# Patient Record
Sex: Female | Born: 1975 | Race: White | Hispanic: No | Smoking: Former smoker
Health system: Southern US, Community
[De-identification: ages and names within clinical notes are randomized; demographics above are authoritative.]

## PROBLEM LIST (undated history)

## (undated) DIAGNOSIS — R0789 Other chest pain: Secondary | ICD-10-CM

## (undated) DIAGNOSIS — F419 Anxiety disorder, unspecified: Secondary | ICD-10-CM

## (undated) DIAGNOSIS — F99 Mental disorder, not otherwise specified: Secondary | ICD-10-CM

## (undated) HISTORY — PX: NO PAST SURGERIES: SHX2092

## (undated) HISTORY — DX: Anxiety disorder, unspecified: F41.9

## (undated) HISTORY — DX: Mental disorder, not otherwise specified: F99

---

## 2016-01-04 ENCOUNTER — Inpatient Hospital Stay
Admit: 2016-01-04 | Discharge: 2016-01-04 | Disposition: A | Discharge status: Home / Self Care | Payer: Self-pay | Attending: Emergency Medicine

## 2016-01-04 DIAGNOSIS — R6884 Jaw pain: Secondary | ICD-10-CM

## 2016-01-04 MED ORDER — METHOCARBAMOL 750 MG TAB
750 mg | ORAL_TABLET | Freq: Three times a day (TID) | ORAL | 0 refills | Status: AC
Start: 2016-01-04 — End: ?

## 2016-01-04 MED ORDER — PREDNISONE 20 MG TAB
20 mg | ORAL | Status: AC
Start: 2016-01-04 — End: 2016-01-04
  Administered 2016-01-04: 15:00:00 via ORAL

## 2016-01-04 MED ORDER — PREDNISONE 20 MG TAB
20 mg | ORAL_TABLET | Freq: Every day | ORAL | 0 refills | Status: AC
Start: 2016-01-04 — End: 2016-01-09

## 2016-01-04 MED ORDER — TRAMADOL 50 MG TAB
50 mg | ORAL_TABLET | Freq: Three times a day (TID) | ORAL | 0 refills | Status: AC | PRN
Start: 2016-01-04 — End: ?

## 2016-01-04 MED FILL — PREDNISONE 20 MG TAB: 20 mg | ORAL | Qty: 3

## 2016-01-04 NOTE — ED Notes (Signed)
Provider at bedside.

## 2016-01-04 NOTE — ED Provider Notes (Signed)
Patient is a 40 y.o. female presenting with jaw pain.   Jaw Pain    Pertinent negatives include no vomiting.   Pt states that she has had left sided jaw pain for 3-4 days. The pain increases with chewing and yawning. She denies any recent dental trauma or injury. Denies any fever, facial swelling or facial erythema. Denies any difficulty breathing, difficulty swallowing, SOB or chest pain.Denies cold symptoms,headache, neck pain, visual changes, focal weakness or rash. Pt states that seh went to [facility] yesterday and was referred to the ED. She states that the pain has kept her awake for a "good part of the night". She states that she took one of her mother's Tylenol #3 without relief.       History reviewed. No pertinent past medical history.    History reviewed. No pertinent surgical history.      History reviewed. No pertinent family history.    Social History     Social History   ??? Marital status: N/A     Spouse name: N/A   ??? Number of children: N/A   ??? Years of education: N/A     Occupational History   ??? Not on file.     Social History Main Topics   ??? Smoking status: Current Some Day Smoker   ??? Smokeless tobacco: Never Used      Comment: 3 cigarrettes a week   ??? Alcohol use No   ??? Drug use: Not on file   ??? Sexual activity: Not on file     Other Topics Concern   ??? Not on file     Social History Narrative   ??? No narrative on file         ALLERGIES: Review of patient's allergies indicates no known allergies.    Review of Systems   Constitutional: Negative for activity change and appetite change.   HENT: Positive for dental problem and ear pain. Negative for facial swelling, sore throat and trouble swallowing.    Eyes: Negative.    Respiratory: Negative for shortness of breath.    Cardiovascular: Negative.    Gastrointestinal: Negative for abdominal pain, diarrhea and vomiting.   Genitourinary: Negative for dysuria.   Musculoskeletal: Negative for back pain and neck pain.   Skin: Negative for color change.    Neurological: Negative for headaches.   Psychiatric/Behavioral: Negative.        Vitals:    01/04/16 0944   BP: 119/76   Pulse: 89   Resp: 18   Temp: 98.1 ??F (36.7 ??C)   SpO2: 100%   Weight: 59.6 kg (131 lb 6 oz)   Height: 5\' 6"  (1.676 m)            Physical Exam   Constitutional: She is oriented to person, place, and time. She appears well-nourished.   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   Right Ear: External ear normal.   Left Ear: External ear normal.   Mouth/Throat: Oropharynx is clear and moist.   No obvious dental caries, gum swelling, or obvious dental abscess   Eyes: Pupils are equal, round, and reactive to light.   Neck: Normal range of motion. Neck supple.   Cardiovascular: Normal rate and regular rhythm.    Pulmonary/Chest: Effort normal and breath sounds normal.   Abdominal: Bowel sounds are normal.   Musculoskeletal: Normal range of motion.   Lymphadenopathy:     She has no cervical adenopathy.   Neurological:  She is alert and oriented to person, place, and time.   Skin: Skin is warm and dry. No rash noted.   Nursing note and vitals reviewed.       MDM  ED Course       Procedures      Mouth care and emergency dental care instructions were reviewed with the pt.  Suspect TMJ pain. Plan to treat conservatively with short course of prednisone and few pain pills; close dental follow up.  10:08 AM  Patient's results and plan of care have been reviewed with her.  Patient has verbally conveyed her understanding and agreement of her signs, symptoms, diagnosis, treatment and prognosis and additionally agrees to follow up as recommended or return to the Emergency Room should her condition change prior to follow-up.  Discharge instructions have also been provided to the patient with some educational information regarding her diagnosis as well a list of reasons why she would want to return to the ER prior to her follow-up appointment should her condition change.   Discussed plan of care with Dr. Marlene BastMason. Yetta NumbersLori Kaneesha Constantino, NP

## 2016-01-04 NOTE — ED Triage Notes (Signed)
Pt c/o Left sided posterior jaw pain for 4-5 days, radiating into L ear, does not radiate into arm or chest. Difficulty eating, bite feels "off," denies trauma. Seen at [facility] last night and told to come to ED. Reports recent stress r/t family issues.

## 2016-02-17 ENCOUNTER — Emergency Department

## 2016-02-17 ENCOUNTER — Emergency Department: Admit: 2016-02-18 | Payer: PRIVATE HEALTH INSURANCE

## 2016-02-17 DIAGNOSIS — R0789 Other chest pain: Secondary | ICD-10-CM

## 2016-02-17 NOTE — ED Triage Notes (Signed)
Patient arrives c/o chest pain that started last night that has gotten progressively worse. +SOB. Patient took a muscle relaxer and charcoal pills with no relief.

## 2016-02-17 NOTE — ED Provider Notes (Signed)
HPI Comments: This is a 41 yo Caucasian female with complaint of mid upper chest pain, constant, 8/10, pleuritic for past 24 hrs.  Feels like "need to belch" but does not improve pain. Pain does not radiate. Tried taking Xanax with minimal improvement.  No cardiac hx.  [facility].  No associated dizziness, extremity numbness, extremity weakness, nausea, vomiting, abdominal pain, nausea, vomiting, LE edema, calf pain, diarrhea, constipation or urinary complaint. No hx of similar.     Patient is a 41 y.o. female presenting with chest pain. The history is provided by the patient.   Chest Pain (Angina)    Associated symptoms include shortness of breath. Pertinent negatives include no abdominal pain, no back pain, no cough, no dizziness, no fever, no headaches, no nausea, no numbness and no vomiting.        History reviewed. No pertinent past medical history.    History reviewed. No pertinent surgical history.      History reviewed. No pertinent family history.    Social History     Social History   ??? Marital status: SINGLE     Spouse name: N/A   ??? Number of children: N/A   ??? Years of education: N/A     Occupational History   ??? Not on file.     Social History Main Topics   ??? Smoking status: Current Some Day Smoker   ??? Smokeless tobacco: Never Used      Comment: 3 cigarrettes a week   ??? Alcohol use No   ??? Drug use: Not on file   ??? Sexual activity: Not on file     Other Topics Concern   ??? Not on file     Social History Narrative         ALLERGIES: Review of patient's allergies indicates no known allergies.    Review of Systems   Constitutional: Negative.  Negative for chills, fatigue and fever.   HENT: Negative.  Negative for congestion, ear pain, facial swelling, rhinorrhea, sneezing and sore throat.    Eyes: Negative for pain, discharge and itching.   Respiratory: Positive for shortness of breath. Negative for cough and chest tightness.     Cardiovascular: Positive for chest pain. Negative for leg swelling.   Gastrointestinal: Negative.  Negative for abdominal distention, abdominal pain, constipation, diarrhea, nausea and vomiting.   Genitourinary: Negative for difficulty urinating, frequency and urgency.   Musculoskeletal: Positive for arthralgias (left elbow). Negative for back pain.   Skin: Negative for color change and rash.   Neurological: Negative for dizziness, numbness and headaches.   All other systems reviewed and are negative.      Vitals:    02/17/16 2134   BP: 101/68   Pulse: (!) 122   Resp: 18   Temp: 98.7 ??F (37.1 ??C)   SpO2: 100%   Weight: 62.2 kg (137 lb 3.2 oz)   Height: 5\' 6"  (1.676 m)            Physical Exam   Constitutional: She is oriented to person, place, and time. She appears well-developed and well-nourished. No distress.   Caucasian female sitting upright appears uncomfortable but in NAD   HENT:   Head: Normocephalic and atraumatic.   Right Ear: External ear normal.   Left Ear: External ear normal.   Nose: Nose normal.   Mouth/Throat: Oropharynx is clear and moist. No oropharyngeal exudate.   Eyes: Conjunctivae and EOM are normal. Pupils are equal, round, and reactive  to light. Right eye exhibits no discharge. Left eye exhibits no discharge. No scleral icterus.   Neck: Normal range of motion.   Cardiovascular: Regular rhythm.  Exam reveals no gallop and no friction rub.    No murmur heard.  Tachy 122     Pulmonary/Chest: Effort normal and breath sounds normal. She has no wheezes. She has no rales. She exhibits no tenderness.   Abdominal: Soft. Bowel sounds are normal. She exhibits no distension. There is no tenderness. There is no rebound and no guarding.   Neurological: She is alert and oriented to person, place, and time.   Skin: Skin is warm and dry. She is not diaphoretic.   Psychiatric: She has a normal mood and affect. Her behavior is normal.   Nursing note and vitals reviewed.       MDM   Number of Diagnoses or Management Options  Diagnosis management comments: 41 yo Caucasian female with 24 hrs of chest pain with reported pleuritic quality. ? ACS vs PE vs PNA amongst others  Plan  EKG  Trop  CBC  CMP  Xray chest  CTA  Analgesia  reassess       Amount and/or Complexity of Data Reviewed  Clinical lab tests: ordered and reviewed  Tests in the radiology section of CPT??: ordered and reviewed          ED Course       Procedures  Progress note    EKG interpretation: (Preliminary)  Rhythm: sinus tachycardia; and regular . Rate (approx.): 110; Axis: normal; P wave: normal; QRS interval: normal ; ST/T wave: normal; in  Leads. Amarrah Meinhart C [place], [place]           Trop - with >24 hrs constant pain. EKG non-ischemic. Electrolytes and CBC unremarkable. ? Anxiety vs GERD. Trek Kimball C [place], [place]    Patient's results have been reviewed with them.  Patient and/or family have verbally conveyed their understanding and agreement of the patient's signs, symptoms, diagnosis, treatment and prognosis and additionally agree to follow up as recommended or return to the Emergency Room should their condition change prior to follow-up.  Discharge instructions have also been provided to the patient with some educational information regarding their diagnosis as well a list of reasons why they would want to return to the ER prior to their follow-up appointment should their condition change. Halynn Reitano C [place], [place]    A/P  Chest pain atypical: Pepcid twice daily. Follow-up with regular doctor. Return for any new or worsening. Leanora Murin C [place], [place]

## 2016-02-18 ENCOUNTER — Inpatient Hospital Stay
Admit: 2016-02-18 | Discharge: 2016-02-18 | Disposition: A | Discharge status: Home / Self Care | Payer: PRIVATE HEALTH INSURANCE | Attending: Emergency Medicine

## 2016-02-18 LAB — EKG 12-LEAD
Atrial Rate: 110 {beats}/min
P Axis: 65 degrees
P-R Interval: 138 ms
Q-T Interval: 326 ms
QRS Duration: 82 ms
QTc Calculation (Bazett): 441 ms
R Axis: 48 degrees
T Axis: 48 degrees
Ventricular Rate: 110 {beats}/min

## 2016-02-18 LAB — CK W/ CKMB & INDEX
CK - MB: <1 NG/ML (ref <3.6)
CK: 126 U/L (ref 26–192)

## 2016-02-18 LAB — METABOLIC PANEL, COMPREHENSIVE
A-G Ratio: 0.9 — ABNORMAL LOW (ref 1.1–2.2)
ALT (SGPT): 20 U/L (ref 12–78)
AST (SGOT): 18 U/L (ref 15–37)
Albumin: 3.7 g/dL (ref 3.5–5.0)
Alk. phosphatase: 61 U/L (ref 45–117)
Anion gap: 5 mmol/L (ref 5–15)
BUN/Creatinine ratio: 11 — ABNORMAL LOW (ref 12–20)
BUN: 8 MG/DL (ref 6–20)
Bilirubin, total: 0.5 MG/DL (ref 0.2–1.0)
CO2: 30 mmol/L (ref 21–32)
Calcium: 8.2 MG/DL — ABNORMAL LOW (ref 8.5–10.1)
Chloride: 103 mmol/L (ref 97–108)
Creatinine: 0.71 MG/DL (ref 0.55–1.02)
GFR est AA: >60 mL/min/{1.73_m2} (ref >60)
GFR est non-AA: >60 mL/min/{1.73_m2} (ref >60)
Globulin: 3.9 g/dL (ref 2.0–4.0)
Glucose: 78 mg/dL (ref 65–100)
Potassium: 3.9 mmol/L (ref 3.5–5.1)
Protein, total: 7.6 g/dL (ref 6.4–8.2)
Sodium: 138 mmol/L (ref 136–145)

## 2016-02-18 LAB — CBC WITH AUTOMATED DIFF
ABS. BASOPHILS: 0 10*3/uL (ref 0.0–0.1)
ABS. EOSINOPHILS: 0.1 10*3/uL (ref 0.0–0.4)
ABS. IMM. GRANS.: 0 10*3/uL (ref 0.00–0.04)
ABS. LYMPHOCYTES: 1.7 10*3/uL (ref 0.8–3.5)
ABS. MONOCYTES: 1 10*3/uL (ref 0.0–1.0)
ABS. NEUTROPHILS: 5.3 10*3/uL (ref 1.8–8.0)
ABSOLUTE NRBC: 0 10*3/uL (ref 0.00–0.01)
BASOPHILS: 0 % (ref 0–1)
EOSINOPHILS: 1 % (ref 0–7)
HCT: 37.8 % (ref 35.0–47.0)
HGB: 12.9 g/dL (ref 11.5–16.0)
IMMATURE GRANULOCYTES: 0 % (ref 0.0–0.5)
LYMPHOCYTES: 21 % (ref 12–49)
MCH: 32.2 PG (ref 26.0–34.0)
MCHC: 34.1 g/dL (ref 30.0–36.5)
MCV: 94.3 FL (ref 80.0–99.0)
MONOCYTES: 12 % (ref 5–13)
MPV: 9.6 FL (ref 8.9–12.9)
NEUTROPHILS: 65 % (ref 32–75)
NRBC: 0 PER 100 WBC
PLATELET: 277 10*3/uL (ref 150–400)
RBC: 4.01 M/uL (ref 3.80–5.20)
RDW: 12.5 % (ref 11.5–14.5)
WBC: 8.1 10*3/uL (ref 3.6–11.0)

## 2016-02-18 LAB — EKG, 12 LEAD, INITIAL
Atrial Rate: 110 {beats}/min
Calculated P Axis: 65 degrees
Calculated R Axis: 48 degrees
Calculated T Axis: 48 degrees
P-R Interval: 138 ms
Q-T Interval: 326 ms
QRS Duration: 82 ms
QTC Calculation (Bezet): 441 ms
Ventricular Rate: 110 {beats}/min

## 2016-02-18 LAB — TROPONIN I: Troponin-I, Qt.: <0.04 ng/mL (ref <0.05)

## 2016-02-18 MED ORDER — IOPAMIDOL 76 % IV SOLN
370 mg iodine /mL (76 %) | Freq: Once | INTRAVENOUS | Status: AC
Start: 2016-02-18 — End: 2016-02-17
  Administered 2016-02-18: 04:00:00 via INTRAVENOUS

## 2016-02-18 MED ORDER — KETOROLAC TROMETHAMINE 30 MG/ML INJECTION
30 mg/mL (1 mL) | INTRAMUSCULAR | Status: AC
Start: 2016-02-18 — End: 2016-02-17
  Administered 2016-02-18: 03:00:00 via INTRAVENOUS

## 2016-02-18 MED ORDER — SODIUM CHLORIDE 0.9% BOLUS IV
0.9 % | Freq: Once | INTRAVENOUS | Status: AC
Start: 2016-02-18 — End: 2016-02-17
  Administered 2016-02-18: 04:00:00 via INTRAVENOUS

## 2016-02-18 MED ORDER — FAMOTIDINE 20 MG TAB
20 mg | ORAL_TABLET | Freq: Two times a day (BID) | ORAL | 0 refills | Status: AC
Start: 2016-02-18 — End: 2016-02-27

## 2016-02-18 MED ORDER — SODIUM CHLORIDE 0.9 % IJ SYRG
Freq: Once | INTRAMUSCULAR | Status: AC
Start: 2016-02-18 — End: 2016-02-17
  Administered 2016-02-18: 04:00:00 via INTRAVENOUS

## 2016-02-18 MED ORDER — ALUM-MAG HYDROXIDE-SIMETH 200 MG-200 MG-20 MG/5 ML ORAL SUSP
200-200-20 mg/5 mL | Freq: Once | ORAL | Status: AC
Start: 2016-02-18 — End: 2016-02-17
  Administered 2016-02-18: 04:00:00 via ORAL

## 2016-02-18 MED FILL — ISOVUE-370  76 % INTRAVENOUS SOLUTION: 370 mg iodine /mL (76 %) | INTRAVENOUS | Qty: 100

## 2016-02-18 MED FILL — SODIUM CHLORIDE 0.9 % IV: INTRAVENOUS | Qty: 100

## 2016-02-18 MED FILL — KETOROLAC TROMETHAMINE 30 MG/ML INJECTION: 30 mg/mL (1 mL) | INTRAMUSCULAR | Qty: 1

## 2016-02-18 MED FILL — MAG-AL PLUS 200 MG-200 MG-20 MG/5 ML ORAL SUSPENSION: 200-200-20 mg/5 mL | ORAL | Qty: 30

## 2016-02-18 MED FILL — NORMAL SALINE FLUSH 0.9 % INJECTION SYRINGE: INTRAMUSCULAR | Qty: 10

## 2016-11-04 ENCOUNTER — Inpatient Hospital Stay: Admit: 2016-11-04 | Discharge: 2016-11-04 | Payer: Self-pay | Attending: Emergency Medicine

## 2016-11-04 DIAGNOSIS — R45851 Suicidal ideations: Secondary | ICD-10-CM

## 2016-11-04 LAB — URINALYSIS W/MICROSCOPIC
Bilirubin: NEGATIVE
Glucose: NEGATIVE mg/dL
Ketone: NEGATIVE mg/dL
Nitrites: NEGATIVE
Protein: NEGATIVE mg/dL
Specific gravity: 1.01 (ref 1.003–1.030)
Urobilinogen: 0.2 EU/dL (ref 0.2–1.0)
pH (UA): 6.5 (ref 5.0–8.0)

## 2016-11-04 LAB — CBC WITH AUTOMATED DIFF
ABS. BASOPHILS: 0 10*3/uL (ref 0.0–0.1)
ABS. EOSINOPHILS: 0 10*3/uL (ref 0.0–0.4)
ABS. IMM. GRANS.: 0 10*3/uL (ref 0.00–0.04)
ABS. LYMPHOCYTES: 2 10*3/uL (ref 0.8–3.5)
ABS. MONOCYTES: 0.5 10*3/uL (ref 0.0–1.0)
ABS. NEUTROPHILS: 4.8 10*3/uL (ref 1.8–8.0)
ABSOLUTE NRBC: 0 10*3/uL (ref 0.00–0.01)
BASOPHILS: 0 % (ref 0–1)
EOSINOPHILS: 1 % (ref 0–7)
HCT: 41.4 % (ref 35.0–47.0)
HGB: 14.4 g/dL (ref 11.5–16.0)
IMMATURE GRANULOCYTES: 0 % (ref 0.0–0.5)
LYMPHOCYTES: 27 % (ref 12–49)
MCH: 34.4 PG — ABNORMAL HIGH (ref 26.0–34.0)
MCHC: 34.8 g/dL (ref 30.0–36.5)
MCV: 99 FL (ref 80.0–99.0)
MONOCYTES: 7 % (ref 5–13)
MPV: 8.8 FL — ABNORMAL LOW (ref 8.9–12.9)
NEUTROPHILS: 65 % (ref 32–75)
NRBC: 0 PER 100 WBC
PLATELET: 218 10*3/uL (ref 150–400)
RBC: 4.18 M/uL (ref 3.80–5.20)
RDW: 14.7 % — ABNORMAL HIGH (ref 11.5–14.5)
WBC: 7.4 10*3/uL (ref 3.6–11.0)

## 2016-11-04 LAB — DRUG SCREEN, URINE
AMPHETAMINES: NEGATIVE
BARBITURATES: NEGATIVE
BENZODIAZEPINES: NEGATIVE
COCAINE: NEGATIVE
METHADONE: NEGATIVE
OPIATES: NEGATIVE
PCP(PHENCYCLIDINE): NEGATIVE
THC (TH-CANNABINOL): NEGATIVE

## 2016-11-04 LAB — SALICYLATE: Salicylate level: <1.7 MG/DL — ABNORMAL LOW (ref 2.8–20.0)

## 2016-11-04 LAB — ETHYL ALCOHOL: ALCOHOL(ETHYL),SERUM: 338 MG/DL — ABNORMAL HIGH (ref <10)

## 2016-11-04 LAB — METABOLIC PANEL, COMPREHENSIVE
A-G Ratio: 1 — ABNORMAL LOW (ref 1.1–2.2)
ALT (SGPT): 22 U/L (ref 12–78)
AST (SGOT): 31 U/L (ref 15–37)
Albumin: 3.7 g/dL (ref 3.5–5.0)
Alk. phosphatase: 67 U/L (ref 45–117)
Anion gap: 6 mmol/L (ref 5–15)
BUN/Creatinine ratio: 20 (ref 12–20)
BUN: 15 MG/DL (ref 6–20)
Bilirubin, total: 0.8 MG/DL (ref 0.2–1.0)
CO2: 26 mmol/L (ref 21–32)
Calcium: 7.9 MG/DL — ABNORMAL LOW (ref 8.5–10.1)
Chloride: 104 mmol/L (ref 97–108)
Creatinine: 0.75 MG/DL (ref 0.55–1.02)
GFR est AA: >60 mL/min/{1.73_m2} (ref >60)
GFR est non-AA: >60 mL/min/{1.73_m2} (ref >60)
Globulin: 3.8 g/dL (ref 2.0–4.0)
Glucose: 99 mg/dL (ref 65–100)
Potassium: 3.7 mmol/L (ref 3.5–5.1)
Protein, total: 7.5 g/dL (ref 6.4–8.2)
Sodium: 136 mmol/L (ref 136–145)

## 2016-11-04 LAB — ACETAMINOPHEN: Acetaminophen level: <2 ug/mL — ABNORMAL LOW (ref 10–30)

## 2016-11-04 LAB — SAMPLES BEING HELD

## 2016-11-04 NOTE — ED Notes (Signed)
Spoke to poison control about 6- 1mg  Klonopin taken in restroom. Glennis with poison control who recommended to monitor patient until back to baseline mentality.

## 2016-11-04 NOTE — ED Notes (Addendum)
Pt upset and wanting to leave, allowed time to vent and educated pt and friend on importance of safety and need for further evaluation. Supportive friend speaking with pt, [facility] place [facility].     Pt in triage booth with friend, is more calm and cooperative after speaking with friend. HPD and [facility] called for stat eval.

## 2016-11-04 NOTE — ED Notes (Signed)
Pt escorted to restroom by supportive friend and RN, friend was in restroom with pt and had belongings in hand but upon exit of bathroom friend suspects pt may have taken the rest of clonazepam 1 mg tablets, there were about 6 left. MD notified, pt in room with HPD and [facility] now. No further orders, will continue to monitor

## 2016-11-04 NOTE — ED Notes (Signed)
Pt on pulse ox, vs stable. Will attempt to get in room. Friend at bedside

## 2016-11-04 NOTE — Progress Notes (Signed)
Care Management     Track Board   **[employer] HIX**  Spoke with Tianna/Registration.  The [employer] ID was put through electonic system and was rejected.  Self Pay

## 2016-11-04 NOTE — ED Provider Notes (Signed)
41 y.o. female with past medical history significant for anxiety and depression who presents from home with chief complaint of SI. Pt has a h/o alcohol and drug abuse and has had a lot of recent social stressors such as her father dying, breaking up with her boyfriend, and losing her job which caused her to relapse. She states that she has been abusing alcohol over the past 1.5 months and has been taking Klonopin, Lexapro, and [place]. She states that she is now "having a hard time not drinking." Pt states that she took 1 mg of Klonapine PTA. The pt friend believes that the pt took 6 Clonazepam while in the waiting room. She also states that the pt has been taking large amounts of these medications as well as "snorting Adderal" and taking Lorazepam or "anything else she can find" over the past 2 weeks. She has also been drinking large amounts of EtOH. Pt's friend states that the pt's father died of EtOH abuse a few weeks ago and the pt has been experiencing depression and SI with self harm since that time. She is concerned that the pt is "going to kill herself to be with her dad." There are no other acute medical concerns at this time.    Social hx: current some day tobacco smoker; (+) EtOH use; (+) illicit drug use (benzodiazepines)    Note written by Nechama GuardNatalie R. Lerch, Scribe, as dictated by Eliezer Champagneavid B Alando Colleran, PA-C 3:20 PM      The history is provided by the patient and a friend. No language interpreter was used.        Past Medical History:   Diagnosis Date   ??? Anxiety    ??? Depression        History reviewed. No pertinent surgical history.      History reviewed. No pertinent family history.    Social History     Socioeconomic History   ??? Marital status: SINGLE     Spouse name: Not on file   ??? Number of children: Not on file   ??? Years of education: Not on file   ??? Highest education level: Not on file   Social Needs   ??? Financial resource strain: Not on file   ??? Food insecurity - worry: Not on file    ??? Food insecurity - inability: Not on file   ??? Transportation needs - medical: Not on file   ??? Transportation needs - non-medical: Not on file   Occupational History   ??? Not on file   Tobacco Use   ??? Smoking status: Current Some Day Smoker   ??? Smokeless tobacco: Never Used   ??? Tobacco comment: 3 cigarrettes a week   Substance and Sexual Activity   ??? Alcohol use: Yes     Alcohol/week: 36.0 oz     Types: 60 Shots of liquor per week     Comment: half a handle a day   ??? Drug use: Yes     Types: Benzodiazepines   ??? Sexual activity: Not on file   Other Topics Concern   ??? Not on file   Social History Narrative   ??? Not on file         ALLERGIES: Patient has no known allergies.    Review of Systems   Constitutional: Negative for chills, diaphoresis and fever.   HENT: Negative for congestion, postnasal drip, rhinorrhea and sore throat.    Eyes: Negative for photophobia, discharge, redness and visual disturbance.  Respiratory: Negative for cough, chest tightness, shortness of breath and wheezing.    Cardiovascular: Negative for chest pain, palpitations and leg swelling.   Gastrointestinal: Negative for abdominal distention, abdominal pain, blood in stool, constipation, diarrhea, nausea and vomiting.   Genitourinary: Negative for difficulty urinating, dysuria, frequency, hematuria and urgency.   Musculoskeletal: Negative for arthralgias, back pain, joint swelling and myalgias.   Skin: Negative for color change and rash.   Neurological: Negative for dizziness, speech difficulty, weakness, light-headedness, numbness and headaches.   Psychiatric/Behavioral: Positive for behavioral problems and dysphoric mood. Negative for confusion.   All other systems reviewed and are negative.      Vitals:    11/04/16 1422 11/04/16 1544 11/04/16 1616 11/04/16 1630   BP: 132/86      Pulse: (!) 119 97  90   Resp: 16   16   Temp: 98.1 ??F (36.7 ??C)      SpO2: 97% 97% 99% 96%            Physical Exam    Constitutional: She is oriented to person, place, and time. She appears well-developed and well-nourished.   HENT:   Head: Normocephalic and atraumatic.   Eyes: Pupils are equal, round, and reactive to light.   Neck: Normal range of motion.   Cardiovascular: Normal rate, regular rhythm and normal heart sounds. Exam reveals no gallop and no friction rub.   No murmur heard.  Pulmonary/Chest: Effort normal and breath sounds normal.   Abdominal: Soft. She exhibits no distension. There is no tenderness.   Musculoskeletal: Normal range of motion.   Neurological: She is alert and oriented to person, place, and time.   Skin: Skin is warm and dry.   Psychiatric: She has a normal mood and affect. Her behavior is normal.        MDM  Number of Diagnoses or Management Options  Diagnosis management comments: Spoke to poison control regarding combination of klonopin and ETOH.  Pt is acting appropriately and there are no signs of acute benzodiazapine/ETOH overdose.  Poison control advised watching in ED and if no change in mentation etc the patient is safe to be discharged.  Pt reports taking 1 mg of klonopin not the 6 pills friend had suggested.  She denies any suicidal ideations.  Pt states that she wants to be admitted for detox.  [facility] came to evaluate pt in ED and explained to her that she does not meet criteria for inpatient admission.  Considering labs, physical exam, and vitals pt does not meet criteria for medical admission at this time either.  Pt and friend are very upset that she is not being admitted for detox.  Pt left before discharge papers were provided.  Eliezer Champagne, PA-C         Procedures

## 2016-11-04 NOTE — Other (Signed)
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 Anxiety    ??? Depression        Axis IV - Death of father, break up, loss of job  Axis V - 46      The Medical Doctor to Psychiatrist conference was not completed.  The Medical Doctor is in agreement with Psychiatrist disposition because of (reason) Admission is not  Recommended at this time.  The plan is admit to discharge and patient left prior to receiving the referrals for SA tx.  The on-call Psychiatrist consulted was Dr. Maxwell Marioneeba  The admitting Psychiatrist will be Dr. Lacretia NicksNAThe admitting Diagnosis is NA  The Payor source is self pay.        Section II - Integrated Summary  Summary:  Patient came in accompanied by a friend.  Patient reported she relapsed on substances and for the last 3-4 months she has been abusing Clonazepam, Krattom, and alcohol.  Patient currently reporting symtpoms of N/V/D.  Patient also indicated her heart has been racing.  Patient [facility] for by ex but he would not pay for detox.  Patient has been seeing a general practicioner who prescribed Clonazepam and Lexapro and seeing a counselor, Clearance Cootsonna Dean.  Per friend they have been reducing her Clonazepam and she has been taking No flowsheet data found.   mg BID instead of 1 mg BID.  Patient is alert and oriented.  She is tearful and distraught especially when talking about the loss of her father.  Patient reported problems with sleep and appetite but denied any suicidal or homicidal ideation and she denied any current hallucinations despite sometimes seeing shadows or shapes.  Patient denied suicidal thoughts and stated several times "I wouldn't do that to my family."  Friend with her denied any suicidal statements in last few days.  Patient  reported she has overdosed in the past when she was 41 yo and cut herself about a month ago to feel something.    The patienthas demonstrated mental capacity to provide informed consent.  The information is given by the patient and friend with patient.  The Chief Complaint is substance abuse and depression.  The Precipitant Factors are death of her father, loss of job and relationship.  Previous Hospitalizations: Yes   Current Psychiatrist and/or Case Manager is Clearance CootsDonna Dean.    Lethality Assessment:    The potential for suicide noted by the following: current substance abuse .  The potential for homicide is not noted.  The patient has not been a perpetrator of sexual or physical abuse.  There are not pending charges.  The patient is not felt to be at risk for self harm or harm to others.  The attending nurse was advised that security has not been notified.    Section III - Psychosocial  The patient's overall mood and attitude is depressed.  Feelings of helplessness and hopelessness are observed by verbal statements.  Generalized anxiety is not observed.  Panic is not observed. Phobias are not observed.  Obsessive compulsive tendencies are not observed.      Section IV - Mental Status Exam  The patient's appearance shows no evidence of impairment.  The patient's behavior  is restless. The patient is oriented to time, place, person and situation.  The patient's speech shows no evidence of impairment.  The patient's mood is depressed and is sad.  The range of affect is constricted.  The patient's thought content demonstrates no evidence of impairment.  The thought process shows no evidence of impairment.  The patient's perception shows no evidence of impairment. The patient's memory shows no evidence of impairment.  The patient's appetite is decreased and shows signs of weight loss.  The patient's sleep has evidence of insomnia. The patient shows no insight.  The patient's judgement is psychologically impaired.                   Section V - Substance Abuse  The patient is using substances.  The patient is using alcohol  with last use on today, benzodiazepines/barbiturates orally  with last use on today and other substances orally   with last use on yesterdayl. The patient has experienced the following withdrawal symptoms: vomiting, diarrhea and heart racing.      Section VI - Living Arrangements  The patient is single.  The patient lives with a friend. The patient has no children.  The patient does plan to return home upon discharge.  The patient does not have legal issues pending. The patient's source of income comes from unemplyed.  Religious and cultural practices have not been voiced at this time.    The patient's greatest support comes from friend and this person will not be involved with the treatment.    The patient has not been in an event described as horrible or outside the realm of ordinary life experience either currently or in the past.  The patient has not been a victim of sexual/physical abuse.    Section VII - Other Areas of Clinical Concern  The highest grade achieved is NA with the overall quality of school experience being described as NA.  The patient is currently unemployed and speaks [place] as a primary language.  The patient has no communication impairments affecting communication. The patient's preference for learning can be described as: can read and write adequately.  The patient's hearing is normal.  The patient's vision is normal.      Loreli Dollar, LPC

## 2016-11-04 NOTE — ED Notes (Signed)
RN notified that patient walked out of unit. This RN attempted to locate patient in parking lot and was unsuccessful. This RN called patient and left message to come back to ER to take out IV. HPS and charge nurse made aware.

## 2016-11-04 NOTE — ED Triage Notes (Signed)
TRIAGE: Pt arrives with c/o withdrawal from clonazepam, lexapro and krattom (herbal stimulant) and alcohol. Pt arrives +ETOH but denies hx of sz. Has n/v, headache, body aches. Pt tearful and +SI with no plan. Has tried to wean self over the last couple of weeks with support of friend but has not been able to.

## 2019-10-10 ENCOUNTER — Other Ambulatory Visit (HOSPITAL_COMMUNITY)
Admission: RE | Admit: 2019-10-10 | Discharge: 2019-10-10 | Disposition: A | Discharge status: Home / Self Care | Payer: Medicaid - Out of State | Source: Ambulatory Visit | Attending: Family Medicine | Admitting: Family Medicine

## 2019-10-10 ENCOUNTER — Ambulatory Visit: Payer: Medicaid - Out of State | Admitting: Family Medicine

## 2019-10-10 ENCOUNTER — Other Ambulatory Visit: Payer: Self-pay

## 2019-10-10 ENCOUNTER — Encounter: Payer: Self-pay | Admitting: Family Medicine

## 2019-10-10 VITALS — BP 108/68 | HR 92 | Ht 66.0 in | Wt 148.0 lb

## 2019-10-10 DIAGNOSIS — Z01419 Encounter for gynecological examination (general) (routine) without abnormal findings: Secondary | ICD-10-CM

## 2019-10-10 DIAGNOSIS — Z124 Encounter for screening for malignant neoplasm of cervix: Secondary | ICD-10-CM | POA: Diagnosis not present

## 2019-10-10 DIAGNOSIS — M90869 Osteopathy in diseases classified elsewhere, unspecified lower leg: Secondary | ICD-10-CM

## 2019-10-10 DIAGNOSIS — A5056 Late congenital syphilitic osteochondropathy: Secondary | ICD-10-CM | POA: Insufficient documentation

## 2019-10-10 DIAGNOSIS — N6320 Unspecified lump in the left breast, unspecified quadrant: Secondary | ICD-10-CM | POA: Diagnosis not present

## 2019-10-10 DIAGNOSIS — G2581 Restless legs syndrome: Secondary | ICD-10-CM | POA: Insufficient documentation

## 2019-10-10 MED ORDER — GABAPENTIN 600 MG PO TABS: 600.0000 mg | ORAL_TABLET | Freq: Three times a day (TID) | ORAL | 3 refills | Status: DC

## 2019-10-10 MED ORDER — CLONAZEPAM 0.5 MG PO TABS: 0.5000 mg | ORAL_TABLET | Freq: Two times a day (BID) | ORAL | 2 refills | Status: DC | PRN

## 2019-10-10 NOTE — Progress Notes (Signed)
  Subjective:     Meagan Knight is a 44 y.o. female and is here for a comprehensive physical exam. The patient reports problems - recent abnormal cycle at 6 wks. Negative UPT x several. Recently moved here from [place]. Wants to achieve pregnancy. Usually cycles are normal.   The following portions of the patient's history were reviewed and updated as appropriate: allergies, current medications, past family history, past medical history, past social history, past surgical history and problem list.  Review of Systems Pertinent items noted in HPI and remainder of comprehensive ROS otherwise negative.   Objective:    BP 108/68   Pulse 92   Ht 5\' 6"  (1.676 m)   Wt 148 lb (67.1 kg)   LMP 10/05/2019   BMI 23.89 kg/m  General appearance: alert, cooperative and appears stated age Head: Normocephalic, without obvious abnormality, atraumatic Neck: no adenopathy, supple, symmetrical, trachea midline and thyroid not enlarged, symmetric, no tenderness/mass/nodules Lungs: clear to auscultation bilaterally Breasts: left breast with cystic, mobile mass at 1 o'clock, no other masses. Right breast is normal Heart: regular rate and rhythm, S1, S2 normal, no murmur, click, rub or gallop Abdomen: soft, non-tender; bowel sounds normal; no masses,  no organomegaly Pelvic: cervix normal in appearance, external genitalia normal, no adnexal masses or tenderness, no cervical motion tenderness, uterus normal size, shape, and consistency and vagina normal without discharge Extremities: extremities normal, atraumatic, no cyanosis or edema Pulses: 2+ and symmetric Skin: Skin color, texture, turgor normal. No rashes or lesions Lymph nodes: Cervical, supraclavicular, and axillary nodes normal. Neurologic: Grossly normal    Assessment:    Healthy female exam.      Plan:   Left breast mass - Imaging ordered - Plan: MM Digital Diagnostic Bilat, US BREAST LTD UNI LEFT INC AXILLA  Screening for malignant neoplasm  of cervix - Plan: Cytology - PAP( [facility])  Encounter for gynecological examination without abnormal finding - discussed fertility, risks of pregnancy in her 17s. REI and donor eggs reviewed. If no pregnancy in 3-4 months, return for w/u.  Restless leg - No PCP yet, but will not be refilling meds on-going. PCP referral. - Plan: clonazePAM (KLONOPIN) 0.5 MG tablet, gabapentin (NEURONTIN) 600 MG tablet  Return in 1 year (on 10/09/2020) for [facility] referral for breast abnormality and diagnostic mammogram.     See After Visit Summary for Counseling Recommendations

## 2019-10-10 NOTE — Assessment & Plan Note (Signed)
Feels cystic. Needs imaging. Ordered.

## 2019-10-10 NOTE — Patient Instructions (Signed)
 Preventive Care 21-43 Years Old, Female Preventive care refers to visits with your health care provider and lifestyle choices that can promote health and wellness. This includes:  A yearly physical exam. This may also be called an annual well check.  Regular dental visits and eye exams.  Immunizations.  Screening for certain conditions.  Healthy lifestyle choices, such as eating a healthy diet, getting regular exercise, not using drugs or products that contain nicotine and tobacco, and limiting alcohol use. What can I expect for my preventive care visit? Physical exam Your health care provider will check your:  Height and weight. This may be used to calculate body mass index (BMI), which tells if you are at a healthy weight.  Heart rate and blood pressure.  Skin for abnormal spots. Counseling Your health care provider may ask you questions about your:  Alcohol, tobacco, and drug use.  Emotional well-being.  Home and relationship well-being.  Sexual activity.  Eating habits.  Work and work environment.  Method of birth control.  Menstrual cycle.  Pregnancy history. What immunizations do I need?  Influenza (flu) vaccine  This is recommended every year. Tetanus, diphtheria, and pertussis (Tdap) vaccine  You may need a Td booster every 10 years. Varicella (chickenpox) vaccine  You may need this if you have not been vaccinated. Human papillomavirus (HPV) vaccine  If recommended by your health care provider, you may need three doses over 6 months. Measles, mumps, and rubella (MMR) vaccine  You may need at least one dose of MMR. You may also need a second dose. Meningococcal conjugate (MenACWY) vaccine  One dose is recommended if you are age 19-21 years and a first-year college student living in a residence hall, or if you have one of several medical conditions. You may also need additional booster doses. Pneumococcal conjugate (PCV13) vaccine  You may need  this if you have certain conditions and were not previously vaccinated. Pneumococcal polysaccharide (PPSV23) vaccine  You may need one or two doses if you smoke cigarettes or if you have certain conditions. Hepatitis A vaccine  You may need this if you have certain conditions or if you travel or work in places where you may be exposed to hepatitis A. Hepatitis B vaccine  You may need this if you have certain conditions or if you travel or work in places where you may be exposed to hepatitis B. Haemophilus influenzae type b (Hib) vaccine  You may need this if you have certain conditions. You may receive vaccines as individual doses or as more than one vaccine together in one shot (combination vaccines). Talk with your health care provider about the risks and benefits of combination vaccines. What tests do I need?  Blood tests  Lipid and cholesterol levels. These may be checked every 5 years starting at age 20.  Hepatitis C test.  Hepatitis B test. Screening  Diabetes screening. This is done by checking your blood sugar (glucose) after you have not eaten for a while (fasting).  Sexually transmitted disease (STD) testing.  BRCA-related cancer screening. This may be done if you have a family history of breast, ovarian, tubal, or peritoneal cancers.  Pelvic exam and Pap test. This may be done every 3 years starting at age 21. Starting at age 30, this may be done every 5 years if you have a Pap test in combination with an HPV test. Talk with your health care provider about your test results, treatment options, and if necessary, the need for more   tests. Follow these instructions at home: Eating and drinking   Eat a diet that includes fresh fruits and vegetables, whole grains, lean protein, and low-fat dairy.  Take vitamin and mineral supplements as recommended by your health care provider.  Do not drink alcohol if: ? Your health care provider tells you not to drink. ? You are  pregnant, may be pregnant, or are planning to become pregnant.  If you drink alcohol: ? Limit how much you have to 0-1 drink a day. ? Be aware of how much alcohol is in your drink. In the [place] one drink equals one 12 oz bottle of beer (355 mL), one 5 oz glass of wine (148 mL), or one 1 oz glass of hard liquor (44 mL). Lifestyle  Take daily care of your teeth and gums.  Stay active. Exercise for at least 30 minutes on 5 or more days each week.  Do not use any products that contain nicotine or tobacco, such as cigarettes, e-cigarettes, and chewing tobacco. If you need help quitting, ask your health care provider.  If you are sexually active, practice safe sex. Use a condom or other form of birth control (contraception) in order to prevent pregnancy and STIs (sexually transmitted infections). If you plan to become pregnant, see your health care provider for a preconception visit. What's next?  Visit your health care provider once a year for a well check visit.  Ask your health care provider how often you should have your eyes and teeth checked.  Stay up to date on all vaccines. This information is not intended to replace advice given to you by your health care provider. Make sure you discuss any questions you have with your health care provider. Document Revised: 09/15/2017 Document Reviewed: 09/15/2017 Elsevier Patient Education  2020 Elsevier Inc.  

## 2019-10-15 LAB — CYTOLOGY - PAP
Adequacy: ABSENT
Comment: NEGATIVE
Diagnosis: NEGATIVE
High risk HPV: NEGATIVE

## 2019-10-26 ENCOUNTER — Other Ambulatory Visit: Payer: Self-pay

## 2019-10-26 ENCOUNTER — Ambulatory Visit
Admission: RE | Admit: 2019-10-26 | Discharge: 2019-10-26 | Disposition: A | Discharge status: Home / Self Care | Payer: Medicaid - Out of State | Source: Ambulatory Visit | Attending: Family Medicine | Admitting: Family Medicine

## 2019-10-26 ENCOUNTER — Other Ambulatory Visit: Payer: Self-pay | Admitting: Family Medicine

## 2019-10-26 DIAGNOSIS — N6310 Unspecified lump in the right breast, unspecified quadrant: Secondary | ICD-10-CM

## 2019-10-26 DIAGNOSIS — N6320 Unspecified lump in the left breast, unspecified quadrant: Secondary | ICD-10-CM

## 2019-10-26 DIAGNOSIS — N6001 Solitary cyst of right breast: Secondary | ICD-10-CM

## 2019-10-26 DIAGNOSIS — D242 Benign neoplasm of left breast: Secondary | ICD-10-CM

## 2020-04-28 ENCOUNTER — Other Ambulatory Visit: Payer: Medicaid - Out of State

## 2020-08-06 ENCOUNTER — Other Ambulatory Visit: Payer: Self-pay | Admitting: Family Medicine

## 2020-08-06 DIAGNOSIS — G2581 Restless legs syndrome: Secondary | ICD-10-CM

## 2020-10-30 ENCOUNTER — Ambulatory Visit (INDEPENDENT_AMBULATORY_CARE_PROVIDER_SITE_OTHER): Payer: Medicaid - Out of State | Admitting: Family Medicine

## 2020-10-30 ENCOUNTER — Encounter: Payer: Self-pay | Admitting: Family Medicine

## 2020-10-30 ENCOUNTER — Encounter: Payer: Medicaid - Out of State | Admitting: Family Medicine

## 2020-10-30 ENCOUNTER — Other Ambulatory Visit: Payer: Self-pay

## 2020-10-30 VITALS — BP 116/78 | HR 89 | Wt 142.0 lb

## 2020-10-30 DIAGNOSIS — G2581 Restless legs syndrome: Secondary | ICD-10-CM

## 2020-10-30 DIAGNOSIS — Z3169 Encounter for other general counseling and advice on procreation: Secondary | ICD-10-CM | POA: Insufficient documentation

## 2020-10-30 DIAGNOSIS — R350 Frequency of micturition: Secondary | ICD-10-CM | POA: Diagnosis not present

## 2020-10-30 DIAGNOSIS — N6320 Unspecified lump in the left breast, unspecified quadrant: Secondary | ICD-10-CM | POA: Diagnosis not present

## 2020-10-30 DIAGNOSIS — Z01419 Encounter for gynecological examination (general) (routine) without abnormal findings: Secondary | ICD-10-CM

## 2020-10-30 LAB — POCT URINALYSIS DIPSTICK
Blood, UA: NEGATIVE
Glucose, UA: NEGATIVE
Ketones, UA: NEGATIVE
Nitrite, UA: NEGATIVE
Protein, UA: NEGATIVE
Spec Grav, UA: 1.02 (ref 1.010–1.025)
Urobilinogen, UA: 0.2 E.U./dL
pH, UA: 6.5 (ref 5.0–8.0)

## 2020-10-30 MED ORDER — GABAPENTIN 600 MG PO TABS: 600.0000 mg | ORAL_TABLET | Freq: Three times a day (TID) | ORAL | 5 refills | Status: DC

## 2020-10-30 NOTE — Progress Notes (Signed)
Subjective:     Meagan Knight is a 45 y.o. female and is here for a comprehensive physical exam. The patient reports no problems Frequent urination. Needs Gabapentin refill. Having regular cycles. Unsure if wants a pregnancy. Not using contraception.    The following portions of the patient's history were reviewed and updated as appropriate: allergies, current medications, past family history, past medical history, past social history, past surgical history, and problem list.  Review of Systems Pertinent items noted in HPI and remainder of comprehensive ROS otherwise negative.   Objective:    BP 116/78   Pulse 89   Wt 142 lb (64.4 kg)   LMP 10/15/2020   BMI 22.92 kg/m  General appearance: alert, cooperative, and appears stated age Head: Normocephalic, without obvious abnormality, atraumatic Neck: no adenopathy, supple, symmetrical, trachea midline, and thyroid not enlarged, symmetric, no tenderness/mass/nodules Lungs: clear to auscultation bilaterally Breasts:  left breast with multiple mobile masses  right breast with less  Heart: regular rate and rhythm, S1, S2 normal, no murmur, click, rub or gallop Abdomen: soft, non-tender; bowel sounds normal; no masses,  no organomegaly Pelvic: external genitalia normal, no adnexal masses or tenderness, no cervical motion tenderness, uterus normal size, shape, and consistency, and vagina normal without discharge Extremities: extremities normal, atraumatic, no cyanosis or edema Pulses: 2+ and symmetric Skin: Skin color, texture, turgor normal. No rashes or lesions Lymph nodes: Cervical, supraclavicular, and axillary nodes normal. Neurologic: Grossly normal    Assessment:    Healthy female exam.      Plan:   Problem List Items Addressed This Visit       Unprioritized   Restless leg    Gabapentin, is working well and has fixed her mood.      Relevant Medications   gabapentin (NEURONTIN) 600 MG tablet   Left breast mass    Repeat  mammogram and u/s      Relevant Orders   MM DIAG BREAST TOMO BILATERAL   US BREAST LTD UNI RIGHT INC AXILLA   US BREAST LTD UNI LEFT INC AXILLA   Infertility counseling    Likely ovulating and having regular cycles. Discussed w/u with HSG and semen analysis. Other options, including donor eggs, REI less appealing to her. Boyfriend is not a fan of adoption or fostering. To let me know if wants to pursue w/u only--will order semen analysis and HSG.      Other Visit Diagnoses     Encounter for gynecological examination without abnormal finding    -  Primary   pap smear is up to date.   Frequent urination       Relevant Orders   POCT urinalysis dipstick (Completed)   Urine Culture      Return in 1 year (on 10/30/2021).    See After Visit Summary for Counseling Recommendations

## 2020-10-30 NOTE — Patient Instructions (Signed)
Preventive Care 21-44 Years Old, Female Preventive care refers to lifestyle choices and visits with your health care provider that can promote health and wellness. This includes: A yearly physical exam. This is also called an annual wellness visit. Regular dental and eye exams. Immunizations. Screening for certain conditions. Healthy lifestyle choices, such as: Eating a healthy diet. Getting regular exercise. Not using drugs or products that contain nicotine and tobacco. Limiting alcohol use. What can I expect for my preventive care visit? Physical exam Your health care provider may check your: Height and weight. These may be used to calculate your BMI (body mass index). BMI is a measurement that tells if you are at a healthy weight. Heart rate and blood pressure. Body temperature. Skin for abnormal spots. Counseling Your health care provider may ask you questions about your: Past medical problems. Family's medical history. Alcohol, tobacco, and drug use. Emotional well-being. Home life and relationship well-being. Sexual activity. Diet, exercise, and sleep habits. Work and work environment. Access to firearms. Method of birth control. Menstrual cycle. Pregnancy history. What immunizations do I need? Vaccines are usually given at various ages, according to a schedule. Your health care provider will recommend vaccines for you based on your age, medical history, and lifestyle or other factors, such as travel or where you work. What tests do I need? Blood tests Lipid and cholesterol levels. These may be checked every 5 years starting at age 20. Hepatitis C test. Hepatitis B test. Screening Diabetes screening. This is done by checking your blood sugar (glucose) after you have not eaten for a while (fasting). STD (sexually transmitted disease) testing, if you are at risk. BRCA-related cancer screening. This may be done if you have a family history of breast, ovarian, tubal, or  peritoneal cancers. Pelvic exam and Pap test. This may be done every 3 years starting at age 21. Starting at age 30, this may be done every 5 years if you have a Pap test in combination with an HPV test. Talk with your health care provider about your test results, treatment options, and if necessary, the need for more tests. Follow these instructions at home: Eating and drinking  Eat a healthy diet that includes fresh fruits and vegetables, whole grains, lean protein, and low-fat dairy products. Take vitamin and mineral supplements as recommended by your health care provider. Do not drink alcohol if: Your health care provider tells you not to drink. You are pregnant, may be pregnant, or are planning to become pregnant. If you drink alcohol: Limit how much you have to 0-1 drink a day. Be aware of how much alcohol is in your drink. In the [place] one drink equals one 12 oz bottle of beer (355 mL), one 5 oz glass of wine (148 mL), or one 1 oz glass of hard liquor (44 mL). Lifestyle Take daily care of your teeth and gums. Brush your teeth every morning and night with fluoride toothpaste. Floss one time each day. Stay active. Exercise for at least 30 minutes 5 or more days each week. Do not use any products that contain nicotine or tobacco, such as cigarettes, e-cigarettes, and chewing tobacco. If you need help quitting, ask your health care provider. Do not use drugs. If you are sexually active, practice safe sex. Use a condom or other form of protection to prevent STIs (sexually transmitted infections). If you do not wish to become pregnant, use a form of birth control. If you plan to become pregnant, see your health care provider   for a prepregnancy visit. Find healthy ways to cope with stress, such as: Meditation, yoga, or listening to music. Journaling. Talking to a trusted person. Spending time with friends and family. Safety Always wear your seat belt while driving or riding in a  vehicle. Do not drive: If you have been drinking alcohol. Do not ride with someone who has been drinking. When you are tired or distracted. While texting. Wear a helmet and other protective equipment during sports activities. If you have firearms in your house, make sure you follow all gun safety procedures. Seek help if you have been physically or sexually abused. What's next? Go to your health care provider once a year for an annual wellness visit. Ask your health care provider how often you should have your eyes and teeth checked. Stay up to date on all vaccines. This information is not intended to replace advice given to you by your health care provider. Make sure you discuss any questions you have with your health care provider. Document Revised: 03/14/2020 Document Reviewed: 09/15/2017 Elsevier Patient Education  2022 Elsevier Inc.  

## 2020-10-30 NOTE — Assessment & Plan Note (Signed)
Gabapentin, is working well and has fixed her mood.

## 2020-10-30 NOTE — Assessment & Plan Note (Signed)
Repeat mammogram and u/s

## 2020-10-30 NOTE — Assessment & Plan Note (Addendum)
Likely ovulating and having regular cycles. Discussed w/u with HSG and semen analysis. Other options, including donor eggs, REI less appealing to her. Boyfriend is not a fan of adoption or fostering. To let me know if wants to pursue w/u only--will order semen analysis and HSG.

## 2020-11-02 LAB — URINE CULTURE

## 2020-11-19 ENCOUNTER — Other Ambulatory Visit: Payer: Medicaid - Out of State

## 2020-12-25 ENCOUNTER — Ambulatory Visit
Admission: RE | Admit: 2020-12-25 | Discharge: 2020-12-25 | Disposition: A | Discharge status: Home / Self Care | Payer: Medicaid - Out of State | Source: Ambulatory Visit | Attending: Family Medicine | Admitting: Family Medicine

## 2020-12-25 ENCOUNTER — Other Ambulatory Visit: Payer: Self-pay

## 2020-12-25 DIAGNOSIS — N6320 Unspecified lump in the left breast, unspecified quadrant: Secondary | ICD-10-CM

## 2021-01-13 ENCOUNTER — Encounter: Payer: Self-pay | Admitting: Family Medicine

## 2021-01-13 DIAGNOSIS — G2581 Restless legs syndrome: Secondary | ICD-10-CM

## 2021-01-13 MED ORDER — CLONAZEPAM 0.5 MG PO TABS: 0.5000 mg | ORAL_TABLET | Freq: Two times a day (BID) | ORAL | 2 refills | Status: DC | PRN

## 2021-02-18 ENCOUNTER — Encounter: Payer: Self-pay | Admitting: Family Medicine

## 2021-04-19 ENCOUNTER — Other Ambulatory Visit: Payer: Self-pay | Admitting: Family Medicine

## 2021-04-19 DIAGNOSIS — G2581 Restless legs syndrome: Secondary | ICD-10-CM

## 2021-04-21 ENCOUNTER — Encounter: Payer: Self-pay | Admitting: Family Medicine

## 2021-04-21 DIAGNOSIS — G2581 Restless legs syndrome: Secondary | ICD-10-CM

## 2021-04-22 MED ORDER — CLONAZEPAM 0.5 MG PO TABS: 0.5000 mg | ORAL_TABLET | Freq: Two times a day (BID) | ORAL | 2 refills | Status: DC | PRN

## 2021-05-21 IMAGING — MG DIGITAL DIAGNOSTIC BILAT W/ TOMO W/ CAD
6 of 10 series · 6 of 30 positions shown · non-contrast
Comparison: None.

CLINICAL DATA: Mass felt on recent physical examination in the
upper outer left breast. The patient does not feel a mass.

EXAM:
DIGITAL DIAGNOSTIC BILATERAL MAMMOGRAM WITH CAD
ULTRASOUND BILATERAL BREAST

[L MLO synth-2D]
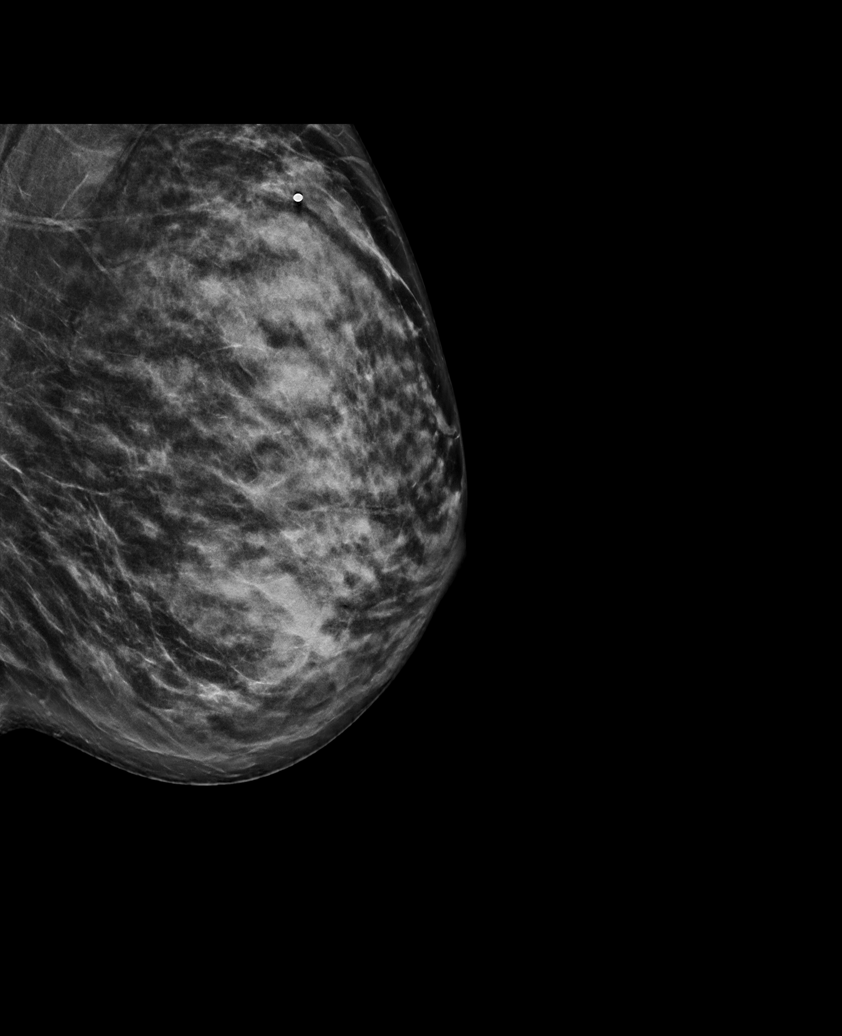

[L CC synth-2D]
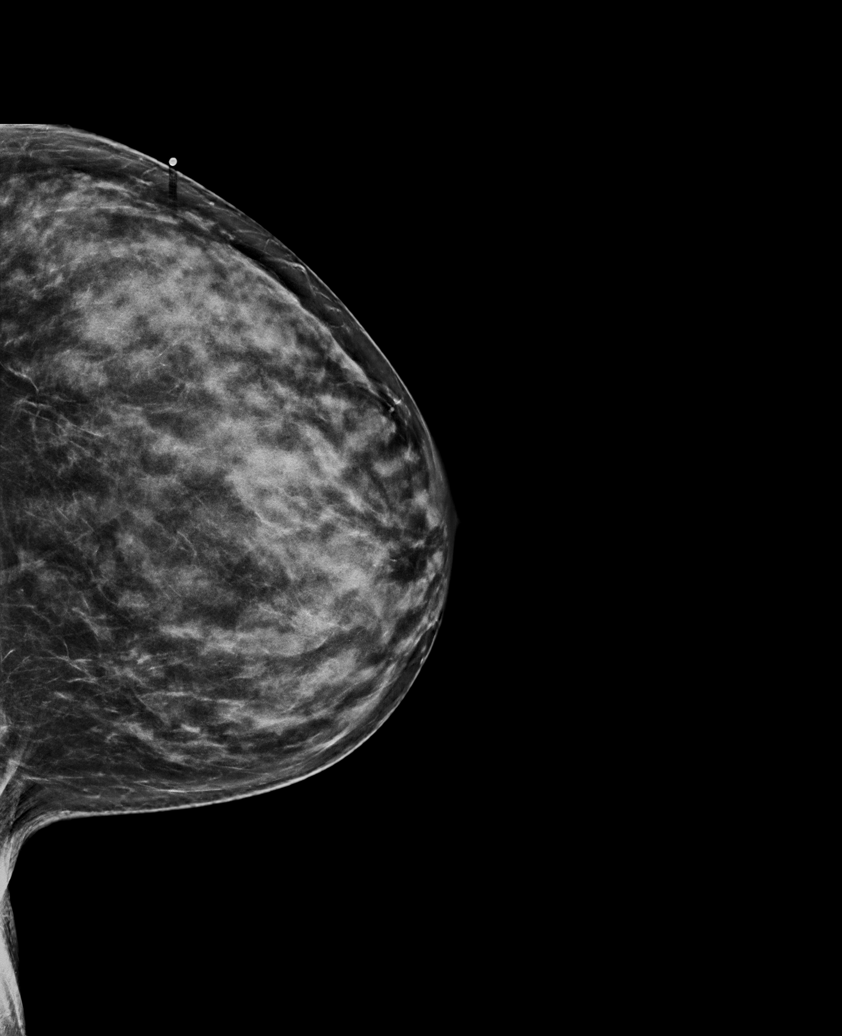

[L TAN synth-2D]
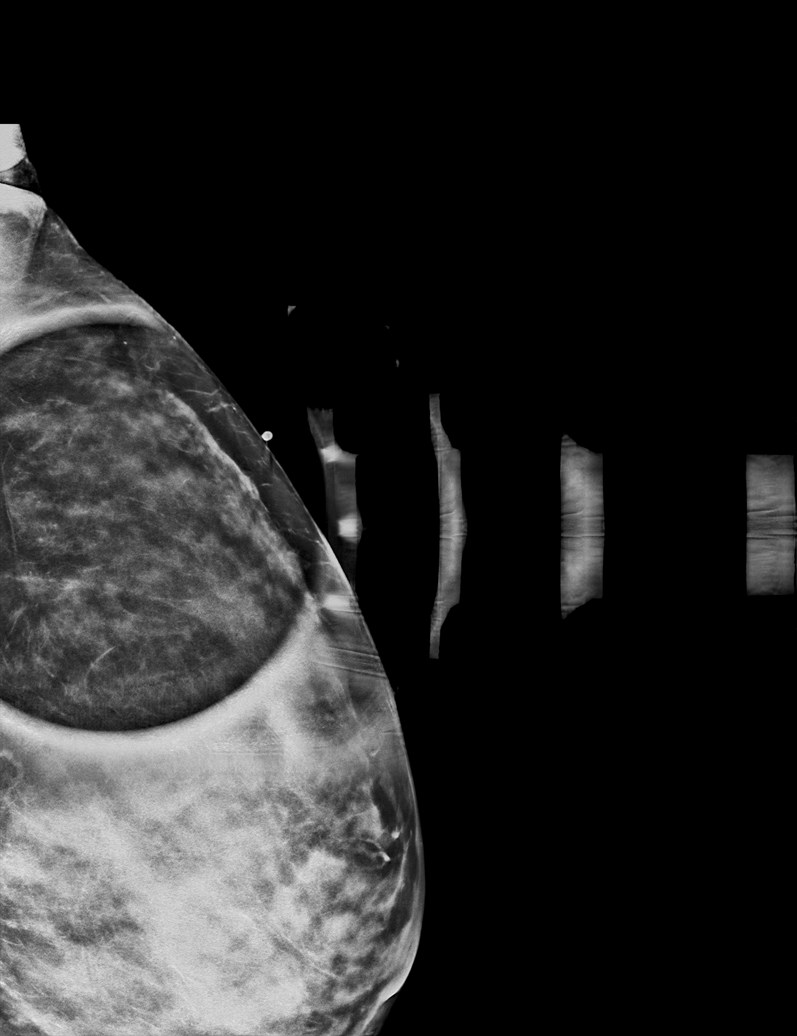

[R CC synth-2D]
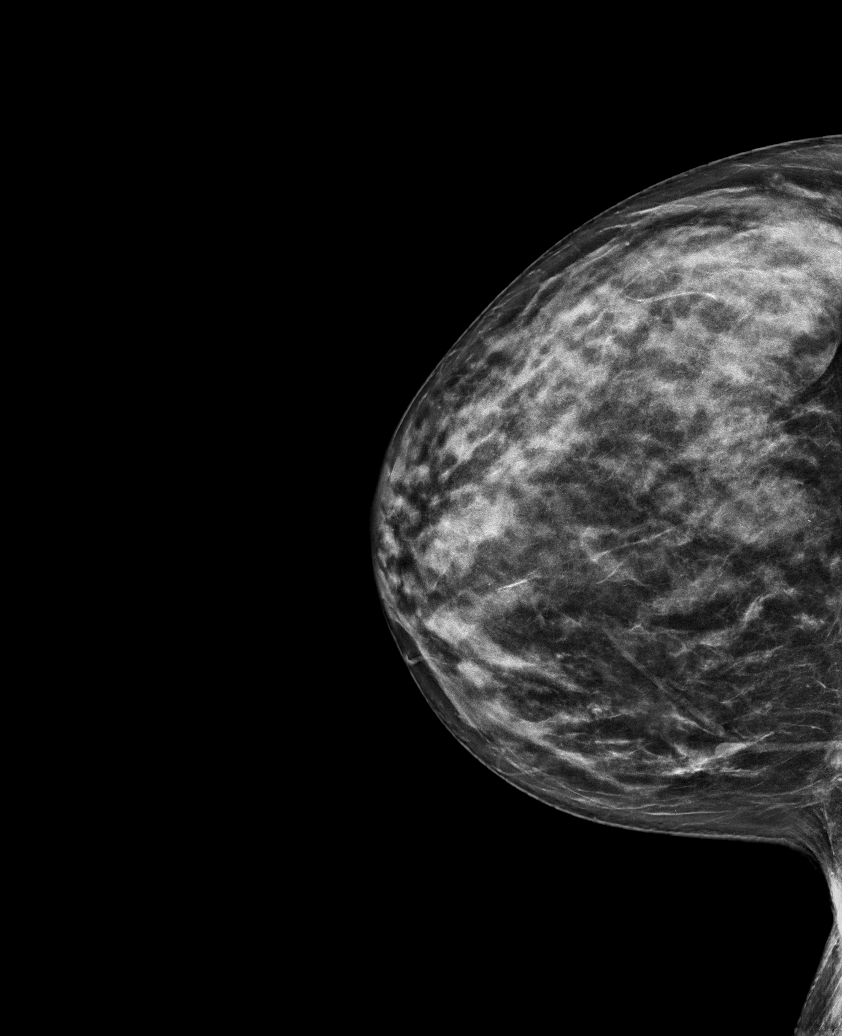

[R MLO synth-2D]
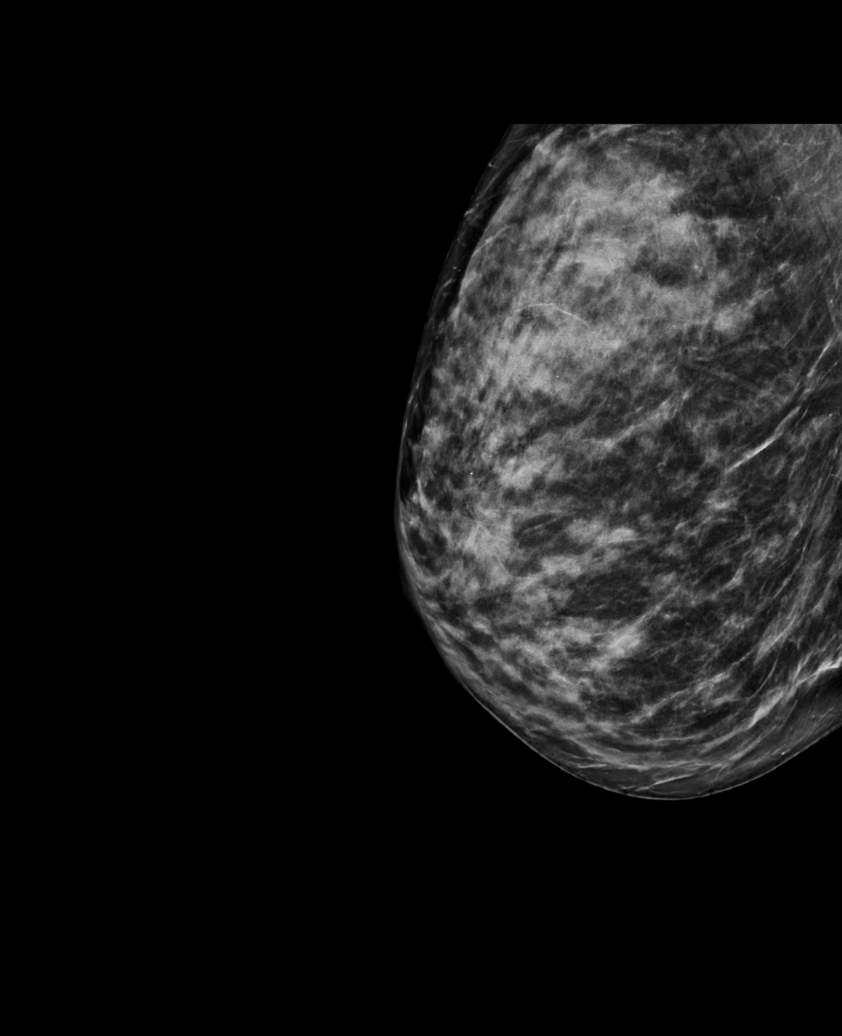

[L TAN tomo · tomo slice 31/62.0]
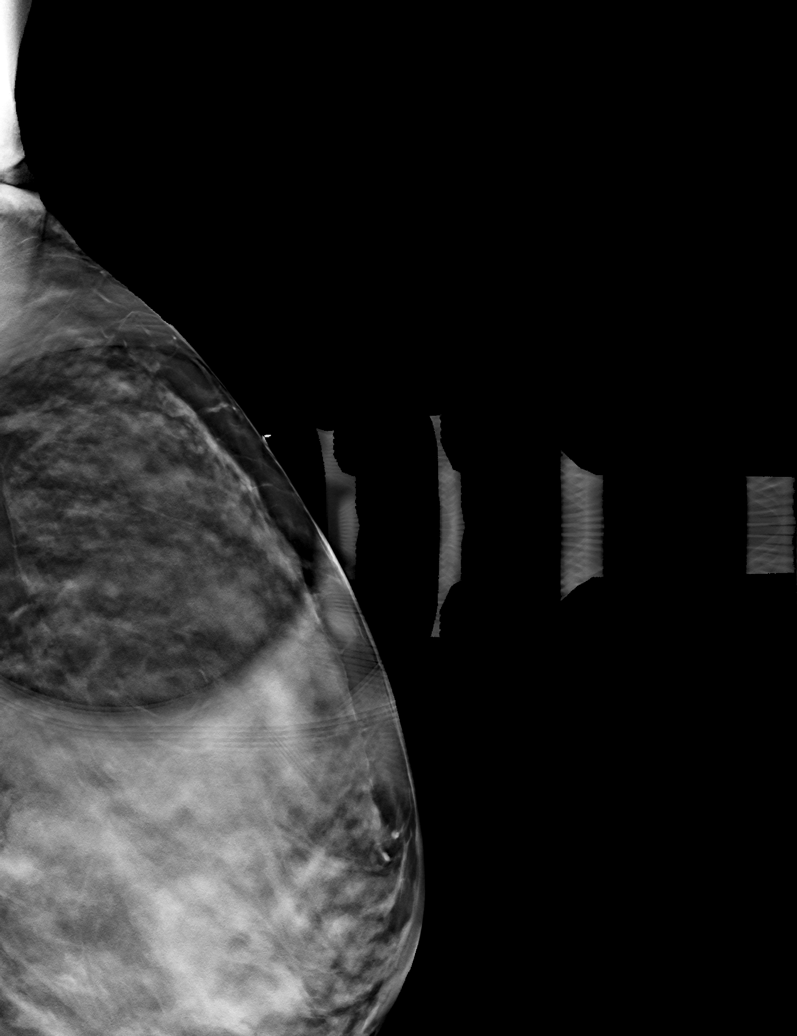

[6 of 30 positions shown; findings below may reference images not displayed]

Baseline.

ACR Breast Density Category c: The breast tissue is heterogeneously
dense, which may obscure small masses.
FINDINGS: In the posterior aspect of the upper-outer right breast, there is a
1 cm oval, partially circumscribed and partially obscured mass
posteriorly.

In the region of the recently felt mass on the left, there is a
cm rounded, partially circumscribed and partially obscured mass.

Mammographic images were processed with CAD.

On physical exam, no mass is palpable in the upper outer quadrant of
either breast.

Targeted ultrasound is performed, showing a 1.3 x 1.0 x 0.6 cm 0,
horizontally oriented, circumscribed, mildly hypoechoic solid mass
with thin internal septations in the 1 o'clock position of the left
breast, 8 cm from the nipple. This corresponds to the mammographic
mass seen in the region of the recently palpated mass. There is a
nearby smaller, oval, cystic appearing mass with internal echoes and
increased through transmission of sound with no internal blood flow
with power Doppler in the 2:30 o'clock position the left breast, 8
cm from the nipple.

In the 11 o'clock position of the right breast, posteriorly, there
is a 0.8 x 0.8 x 0.6 cm oval, horizontally oriented, circumscribed,
hypoechoic mass containing a thin partial internal septation. This
has low-level internal echoes with no internal blood flow with power
Doppler and increased through transmission of sound. This
corresponds to the mammographic mass. There are 2 nearby smaller,
similar-appearing masses.
IMPRESSION: 1. 1.3 cm probable benign fibroadenoma in the 1 o'clock position of
the left breast.
2. 0.8 cm probable mildly complicated cyst in the 11 o'clock
position of the right breast.

RECOMMENDATION:
Bilateral breast ultrasound in 6 months. The option of
ultrasound-guided core needle biopsy of the 1.3 cm probable
fibroadenoma in the left breast was discussed the patient and her
boyfriend. She is currently comfortable with the 6 month follow-up.

I have discussed the findings and recommendations with the patient.
If applicable, a reminder letter will be sent to the patient
regarding the next appointment.

BI-RADS CATEGORY  3: Probably benign.

## 2021-06-22 ENCOUNTER — Encounter (INDEPENDENT_AMBULATORY_CARE_PROVIDER_SITE_OTHER): Payer: Self-pay | Admitting: Family Medicine

## 2021-06-22 DIAGNOSIS — B9689 Other specified bacterial agents as the cause of diseases classified elsewhere: Secondary | ICD-10-CM

## 2021-06-22 DIAGNOSIS — N760 Acute vaginitis: Secondary | ICD-10-CM

## 2021-06-22 DIAGNOSIS — G2581 Restless legs syndrome: Secondary | ICD-10-CM

## 2021-06-23 MED ORDER — CLONAZEPAM 0.5 MG PO TABS: 0.5000 mg | ORAL_TABLET | Freq: Two times a day (BID) | ORAL | 0 refills | Status: DC | PRN

## 2021-06-23 MED ORDER — METRONIDAZOLE 500 MG PO TABS: 500.0000 mg | ORAL_TABLET | Freq: Two times a day (BID) | ORAL | 0 refills | Status: AC

## 2021-06-23 NOTE — Telephone Encounter (Signed)
Please see the MyChart message reply(ies) for my assessment and plan.    This patient gave consent for this Medical Advice Message and is aware that it may result in a bill to their insurance company, as well as the possibility of receiving a bill for a co-payment or deductible. They are an established patient, but are not seeking medical advice exclusively about a problem treated during an in person or video visit in the last seven days. I did not recommend an in person or video visit within seven days of my reply.    I spent a total of 11 minutes cumulative time within 7 days through MyChart messaging.  Coleman Kalas S Yarimar Lavis, MD   

## 2021-07-27 ENCOUNTER — Encounter: Payer: Self-pay | Admitting: Family Medicine

## 2021-07-27 ENCOUNTER — Ambulatory Visit: Payer: 59 | Admitting: Family Medicine

## 2021-07-27 VITALS — BP 95/63 | HR 87 | Ht 66.0 in | Wt 140.0 lb

## 2021-07-27 DIAGNOSIS — Z3169 Encounter for other general counseling and advice on procreation: Secondary | ICD-10-CM

## 2021-07-27 DIAGNOSIS — R69 Illness, unspecified: Secondary | ICD-10-CM | POA: Diagnosis not present

## 2021-07-27 DIAGNOSIS — R6882 Decreased libido: Secondary | ICD-10-CM | POA: Insufficient documentation

## 2021-07-27 DIAGNOSIS — R195 Other fecal abnormalities: Secondary | ICD-10-CM | POA: Diagnosis not present

## 2021-07-27 NOTE — Progress Notes (Signed)
Patient states she is stopping suboxone in about a week. Patient has had low libido and estrogen.  Patient states she is having "weird stools" and would like referral to GI  Last Mammogram Feb 2023 but patient requests breast exam today. [facility]

## 2021-07-27 NOTE — Assessment & Plan Note (Signed)
Again discussed work-up--she will consider and let us know.

## 2021-07-27 NOTE — Assessment & Plan Note (Signed)
Discussed at length--she will discuss further with her therapist. Has some trauma that needs to be dealt with. Do no think any labs are indicated at this time. She does not appear to be in menopause.

## 2021-07-27 NOTE — Progress Notes (Signed)
    Subjective:    Patient ID: Meagan Knight is a 46 y.o. female presenting with Consult  on 07/27/2021  HPI: One year h/o poor libido. Notes.some anxiety. Feels like the gabapentin helps her mood. Whatever is missing in her life, the gabapentin helps. Has h/o depression and anxiety and has felt a lot of her symptoms are improve with the Gabapentin.  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 ? Natural opiate, from [place]. 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 Reports she is coming off this in the next week. Cycles remain normal. Reports change in stool caliber x 2-3 months. + FH of colon cancer, would like screening.  Review of Systems  Constitutional:  Negative for chills and fever.  Respiratory:  Negative for shortness of breath.   Cardiovascular:  Negative for chest pain.  Gastrointestinal:  Negative for abdominal pain, nausea and vomiting.  Genitourinary:  Negative for dysuria.  Skin:  Negative for rash.      Objective:    BP 95/63   Pulse 87   Ht '5\' 6"'$  (1.676 m)   Wt 140 lb (63.5 kg)   LMP 07/13/2021 (Exact Date)   BMI 22.60 kg/m  Physical Exam Exam conducted with a chaperone present.  Constitutional:      General: She is not in acute distress.    Appearance: She is well-developed.  HENT:     Head: Normocephalic and atraumatic.  Eyes:     General: No scleral icterus. Cardiovascular:     Rate and Rhythm: Normal rate.  Pulmonary:     Effort: Pulmonary effort is normal.  Chest:     Comments: Multiple mobile masses are present in both breasts--has mammogram again in December. Abdominal:     Palpations: Abdomen is soft.  Musculoskeletal:     Cervical back: Neck supple.  Skin:    General: Skin is warm and dry.  Neurological:     Mental Status: She is alert and oriented to person, place, and time.         Assessment & Plan:   Problem List Items Addressed This Visit       Unprioritized   Infertility  counseling    Again discussed work-up--she will consider and let us know.      Decreased libido    Discussed at length--she will discuss further with her therapist. Has some trauma that needs to be dealt with. Do no think any labs are indicated at this time. She does not appear to be in menopause.      Other Visit Diagnoses     Change in stool caliber    -  Primary   referral to GI   Relevant Orders   Ambulatory referral to Gastroenterology      Return if symptoms worsen or fail to improve.  Meagan Jude, MD 07/27/2021 11:04 AM

## 2021-07-28 ENCOUNTER — Encounter: Payer: Self-pay | Admitting: Gastroenterology

## 2021-08-11 DIAGNOSIS — R69 Illness, unspecified: Secondary | ICD-10-CM | POA: Diagnosis not present

## 2021-08-11 DIAGNOSIS — F1291 Cannabis use, unspecified, in remission: Secondary | ICD-10-CM | POA: Diagnosis not present

## 2021-08-11 DIAGNOSIS — F1491 Cocaine use, unspecified, in remission: Secondary | ICD-10-CM | POA: Diagnosis not present

## 2021-08-11 DIAGNOSIS — F419 Anxiety disorder, unspecified: Secondary | ICD-10-CM | POA: Diagnosis not present

## 2021-08-11 DIAGNOSIS — F1021 Alcohol dependence, in remission: Secondary | ICD-10-CM | POA: Diagnosis not present

## 2021-08-11 DIAGNOSIS — F1310 Sedative, hypnotic or anxiolytic abuse, uncomplicated: Secondary | ICD-10-CM | POA: Diagnosis not present

## 2021-08-19 DIAGNOSIS — F1021 Alcohol dependence, in remission: Secondary | ICD-10-CM | POA: Diagnosis not present

## 2021-08-19 DIAGNOSIS — F1310 Sedative, hypnotic or anxiolytic abuse, uncomplicated: Secondary | ICD-10-CM | POA: Diagnosis not present

## 2021-08-19 DIAGNOSIS — R69 Illness, unspecified: Secondary | ICD-10-CM | POA: Diagnosis not present

## 2021-08-19 DIAGNOSIS — F1291 Cannabis use, unspecified, in remission: Secondary | ICD-10-CM | POA: Diagnosis not present

## 2021-08-19 DIAGNOSIS — F1491 Cocaine use, unspecified, in remission: Secondary | ICD-10-CM | POA: Diagnosis not present

## 2021-08-26 ENCOUNTER — Ambulatory Visit: Payer: 59 | Admitting: Gastroenterology

## 2021-08-26 DIAGNOSIS — F1021 Alcohol dependence, in remission: Secondary | ICD-10-CM | POA: Diagnosis not present

## 2021-08-26 DIAGNOSIS — R69 Illness, unspecified: Secondary | ICD-10-CM | POA: Diagnosis not present

## 2021-08-26 DIAGNOSIS — F1291 Cannabis use, unspecified, in remission: Secondary | ICD-10-CM | POA: Diagnosis not present

## 2021-08-26 DIAGNOSIS — F1310 Sedative, hypnotic or anxiolytic abuse, uncomplicated: Secondary | ICD-10-CM | POA: Diagnosis not present

## 2021-08-26 DIAGNOSIS — F1491 Cocaine use, unspecified, in remission: Secondary | ICD-10-CM | POA: Diagnosis not present

## 2021-08-27 ENCOUNTER — Ambulatory Visit: Payer: 59 | Admitting: Gastroenterology

## 2021-08-27 ENCOUNTER — Encounter: Payer: Self-pay | Admitting: Gastroenterology

## 2021-08-27 VITALS — BP 128/74 | HR 67 | Ht 66.0 in | Wt 141.8 lb

## 2021-08-27 DIAGNOSIS — R194 Change in bowel habit: Secondary | ICD-10-CM

## 2021-08-27 MED ORDER — NA SULFATE-K SULFATE-MG SULF 17.5-3.13-1.6 GM/177ML PO SOLN: 1.0000 | Freq: Once | ORAL | 0 refills | Status: AC

## 2021-08-27 NOTE — Progress Notes (Signed)
Referring Provider: Donnamae Jude, MD Primary Care Physician:  Patient, No Pcp Per  Reason for Consultation:  Change in stool caliber   IMPRESSION:  Change in bowel habits. No alarm features.  May be medication related. Organic GI disease must be considered.   Family history of colon cancer. Given her family history, we discussed colonoscopy  PLAN: - Add a daily stool bulking agent like Metamucil or Benefiber - Colonoscopy for colon cancer screening given the family history   HPI: Meagan Knight is a 46 y.o. female referred by Dr. Kennon Rounds for further evaluation of a change in stool caliber. The history is obtained through the patient and review of her electronic health record. She has a history of anxiety, restless leg syndrome, and left breast mass.  Previously on Kratom for restless leg syndrome and has used Suboxone to get off. Her stool caliber changes after the medication switch. Stool now looks like little Cheetos or mushrooms. Associated gas. There is a sense of incomplete evacuation and straining. No history of manual assistance with defecation.   At baseline, stools are bigger and bulkier.   She is on keto diet and is now only a lazy keto diet using a lot of bars. She drinks a lot of Diet Coke and is trying to adjust to unsweetened tea.   Paternal aunt and paternal great uncle x 2 had colon colonoscopy. There is no known family history of colon cancer or polyps. No family history of stomach cancer or other GI malignancy. No family history of inflammatory bowel disease or celiac.   No recent labs. No prior abdominal imaging. No prior endoscopic evaluation.    Past Medical History:  Diagnosis Date   Anxiety    Mental disorder     Past Surgical History:  Procedure Laterality Date   NO PAST SURGERIES      Current Outpatient Medications  Medication Sig Dispense Refill   clonazePAM (KLONOPIN) 0.5 MG tablet Take 1 tablet (0.5 mg total) by mouth 2 (two) times daily as  needed for anxiety. 60 tablet 0   cloNIDine (CATAPRES) 0.1 MG tablet Take 0.1-0.2 mg by mouth as needed.     gabapentin (NEURONTIN) 600 MG tablet Take 1 tablet (600 mg total) by mouth 3 (three) times daily. 270 tablet 5   Multiple Vitamin (MULTIVITAMIN) tablet Take 1 tablet by mouth daily.     SUBOXONE 8-2 MG FILM Place under the tongue 2 (two) times daily.     No current facility-administered medications for this visit.    Allergies as of 08/27/2021   (No Known Allergies)    Family History  Problem Relation Age of Onset   COPD Mother    Alcoholism Father    Colon cancer Paternal Aunt    Colon cancer Other        great uncle   Stomach cancer Neg Hx    Esophageal cancer Neg Hx    Colon polyps Neg Hx     Social History   Socioeconomic History   Marital status: Single    Spouse name: Not on file   Number of children: 0   Years of education: Not on file   Highest education level: Not on file  Occupational History   Occupation: Community education officer  Tobacco Use   Smoking status: Former    Packs/day: 0.25    Types: Cigarettes    Quit date: 01/19/2020    Years since quitting: 1.6   Smokeless tobacco: Never  Vaping Use  Vaping Use: Some days  Substance and Sexual Activity   Alcohol use: Never   Drug use: Never   Sexual activity: Yes    Birth control/protection: None  Other Topics Concern   Not on file  Social History Narrative   Not on file   Social Determinants of Health   Financial Resource Strain: Not on file  Food Insecurity: Not on file  Transportation Needs: Not on file  Physical Activity: Not on file  Stress: Not on file  Social Connections: Not on file  Intimate Partner Violence: Not on file    Review of Systems: 12 system ROS is negative except as noted above.   Physical Exam: General:   Alert,  well-nourished, pleasant and cooperative in NAD Head:  Normocephalic and atraumatic. Eyes:  Sclera clear, no icterus.   Conjunctiva pink. Ears:  Normal  auditory acuity. Nose:  No deformity, discharge,  or lesions. Mouth:  No deformity or lesions.   Neck:  Supple; no masses or thyromegaly. Lungs:  Clear throughout to auscultation.   No wheezes. Heart:  Regular rate and rhythm; no murmurs. Abdomen:  Soft, nontender, nondistended, normal bowel sounds, no rebound or guarding. No hepatosplenomegaly.   Rectal:  Deferred  Msk:  Symmetrical. No boney deformities LAD: No inguinal or umbilical LAD Extremities:  No clubbing or edema. Neurologic:  Alert and  oriented x4;  grossly nonfocal Skin:  Intact without significant lesions or rashes. Psych:  Alert and cooperative. Normal mood and affect.    Elidia Bonenfant L. Tarri Glenn, MD, MPH 08/29/2021, 3:34 PM

## 2021-08-27 NOTE — Patient Instructions (Addendum)
It was my pleasure to provide care to you today. Based on our discussion, I am providing you with my recommendations below:  RECOMMENDATION(S):   Add a daily stool bulking agent like Metamucil or Benefiber.  COLONOSCOPY:   You have been scheduled for a colonoscopy. Please follow written instructions given to you at your visit today.   PREP:   Please pick up your prep supplies at the pharmacy within the next 1-3 days.  COLONOSCOPY TIPS:  To reduce nausea and dehydration, stay well hydrated for 3-4 days prior to the exam.  To prevent skin/hemorrhoid irritation - prior to wiping, put A&Dointment or vaseline on the toilet paper. Keep a towel or pad on the bed.  BEFORE STARTING YOUR PREP, drink  64oz of clear liquids in the morning. This will help to flush the colon and will ensure you are well hydrated!!!!  NOTE - This is in addition to the fluids required for to complete your prep. Use of a flavored hard candy, such as grape Anise Salvo, can counteract some of the flavor of the prep and may prevent some nausea.    FOLLOW UP:  After your procedure, you will receive a call from my office staff regarding my recommendation for follow up.  BMI:  If you are age 78 or older, your body mass index should be between 23-30. Your Body mass index is 22.89 kg/m. If this is out of the aforementioned range listed, please consider follow up with your Primary Care Provider.  If you are age 53 or younger, your body mass index should be between 19-25. Your Body mass index is 22.89 kg/m. If this is out of the aformentioned range listed, please consider follow up with your Primary Care Provider.   MY CHART:  The [facility] providers would like to encourage you to use [facility] to communicate with providers for non-urgent requests or questions.  Due to long hold times on the telephone, sending your provider a message by [facility] may be a faster and more efficient way to get a response.  Please allow 48  business hours for a response.  Please remember that this is for non-urgent requests.   Thank you for trusting me with your gastrointestinal care!    Thornton Park, MD, MPH

## 2021-08-28 DIAGNOSIS — R69 Illness, unspecified: Secondary | ICD-10-CM | POA: Diagnosis not present

## 2021-08-29 ENCOUNTER — Encounter: Payer: Self-pay | Admitting: Gastroenterology

## 2021-09-02 DIAGNOSIS — R69 Illness, unspecified: Secondary | ICD-10-CM | POA: Diagnosis not present

## 2021-09-02 DIAGNOSIS — F419 Anxiety disorder, unspecified: Secondary | ICD-10-CM | POA: Diagnosis not present

## 2021-09-02 DIAGNOSIS — F1291 Cannabis use, unspecified, in remission: Secondary | ICD-10-CM | POA: Diagnosis not present

## 2021-09-02 DIAGNOSIS — F1021 Alcohol dependence, in remission: Secondary | ICD-10-CM | POA: Diagnosis not present

## 2021-09-02 DIAGNOSIS — F1491 Cocaine use, unspecified, in remission: Secondary | ICD-10-CM | POA: Diagnosis not present

## 2021-09-07 ENCOUNTER — Encounter: Payer: Self-pay | Admitting: Gastroenterology

## 2021-09-09 DIAGNOSIS — F1120 Opioid dependence, uncomplicated: Secondary | ICD-10-CM | POA: Diagnosis not present

## 2021-09-14 ENCOUNTER — Encounter: Payer: Self-pay | Admitting: Internal Medicine

## 2021-09-14 ENCOUNTER — Ambulatory Visit (AMBULATORY_SURGERY_CENTER): Payer: 59 | Admitting: Internal Medicine

## 2021-09-14 VITALS — BP 114/62 | HR 58 | Temp 98.0°F | Resp 15 | Ht 66.0 in | Wt 141.0 lb

## 2021-09-14 DIAGNOSIS — Z800 Family history of malignant neoplasm of digestive organs: Secondary | ICD-10-CM

## 2021-09-14 DIAGNOSIS — R194 Change in bowel habit: Secondary | ICD-10-CM

## 2021-09-14 DIAGNOSIS — Z1211 Encounter for screening for malignant neoplasm of colon: Secondary | ICD-10-CM

## 2021-09-14 HISTORY — PX: COLONOSCOPY: SHX174

## 2021-09-14 MED ORDER — SODIUM CHLORIDE 0.9 % IV SOLN: 500.0000 mL | Freq: Once | INTRAVENOUS | Status: DC

## 2021-09-14 NOTE — Op Note (Signed)
[place] Patient Name: Meagan Knight Procedure Date: 09/14/2021 2:58 PM MRN: [id number] Endoscopist: Docia Chuck. Henrene Pastor , MD Age: 46 Referring MD:  Date of Birth: [birth year] Gender: Female Account #: [id number] Procedure:                Colonoscopy Indications:              Colon cancer screening in patient at increased                            risk: Family history of colorectal cancer in                            multiple 2nd degree relatives, Incidental change in                            bowel habits noted Medicines:                Monitored Anesthesia Care Procedure:                Pre-Anesthesia Assessment:                           - Prior to the procedure, a History and Physical                            was performed, and patient medications and                            allergies were reviewed. The patient's tolerance of                            previous anesthesia was also reviewed. The risks                            and benefits of the procedure and the sedation                            options and risks were discussed with the patient.                            All questions were answered, and informed consent                            was obtained. Prior Anticoagulants: The patient has                            taken no previous anticoagulant or antiplatelet                            agents. ASA Grade Assessment: II - A patient with                            mild systemic disease. After reviewing the risks  and benefits, the patient was deemed in                            satisfactory condition to undergo the procedure.                           After obtaining informed consent, the colonoscope                            was passed under direct vision. Throughout the                            procedure, the patient's blood pressure, pulse, and                            oxygen saturations were monitored continuously. The                             Olympus CF-HQ190L (Serial# [id number]) Colonoscope was                            introduced through the anus and advanced to the the                            cecum, identified by appendiceal orifice and                            ileocecal valve. The ileocecal valve, appendiceal                            orifice, and rectum were photographed. The quality                            of the bowel preparation was excellent. The                            colonoscopy was performed without difficulty. The                            patient tolerated the procedure well. The bowel                            preparation used was SUPREP via split dose                            instruction. Scope In: 3:08:59 PM Scope Out: 3:23:00 PM Scope Withdrawal Time: 0 hours 10 minutes 33 seconds  Total Procedure Duration: 0 hours 14 minutes 1 second  Findings:                 The entire examined colon appeared normal on direct                            and retroflexion views. Complications:  No immediate complications. Estimated blood loss:                            None. Estimated Blood Loss:     Estimated blood loss: none. Impression:               - The entire examined colon is normal on direct and                            retroflexion views.                           - No specimens collected. Recommendation:           - Repeat colonoscopy in 10 years for screening                            purposes.                           - Patient has a contact number available for                            emergencies. The signs and symptoms of potential                            delayed complications were discussed with the                            patient. Return to normal activities tomorrow.                            Written discharge instructions were provided to the                            patient.                           - Resume previous diet.                            - Continue present medications.                           - Recommend fortified fiber CITRUCEL. Take 2                            tablespoons daily in 14 ounces of water or juice.                            You can pick this up over-the-counter. This should                            improve your bowel habit and consistency                           - Follow-up with Dr.  Beavers as needed Docia Chuck. Henrene Pastor, MD 09/14/2021 3:28:36 PM This report has been signed electronically.

## 2021-09-14 NOTE — Patient Instructions (Signed)
Recommend Citrucel fiber 2 tablespoons in 12-14 oz of liquid daily to keep bowel movements regular   YOU HAD AN ENDOSCOPIC PROCEDURE TODAY AT [facility]:   Refer to the procedure report that was given to you for any specific questions about what was found during the examination.  If the procedure report does not answer your questions, please call your gastroenterologist to clarify.  If you requested that your care partner not be given the details of your procedure findings, then the procedure report has been included in a sealed envelope for you to review at your convenience later.  YOU SHOULD EXPECT: Some feelings of bloating in the abdomen. Passage of more gas than usual.  Walking can help get rid of the air that was put into your GI tract during the procedure and reduce the bloating. If you had a lower endoscopy (such as a colonoscopy or flexible sigmoidoscopy) you may notice spotting of blood in your stool or on the toilet paper. If you underwent a bowel prep for your procedure, you may not have a normal bowel movement for a few days.  Please Note:  You might notice some irritation and congestion in your nose or some drainage.  This is from the oxygen used during your procedure.  There is no need for concern and it should clear up in a day or so.  SYMPTOMS TO REPORT IMMEDIATELY:  Following lower endoscopy (colonoscopy or flexible sigmoidoscopy):  Excessive amounts of blood in the stool  Significant tenderness or worsening of abdominal pains  Swelling of the abdomen that is new, acute  Fever of 100F or higher  For urgent or emergent issues, a gastroenterologist can be reached at any hour by calling [phone]. Do not use MyChart messaging for urgent concerns.    DIET:  We do recommend a small meal at first, but then you may proceed to your regular diet.  Drink plenty of fluids but you should avoid alcoholic beverages for 24 hours.  ACTIVITY:  You should plan to take it  easy for the rest of today and you should NOT DRIVE or use heavy machinery until tomorrow (because of the sedation medicines used during the test).    FOLLOW UP: Our staff will call the number listed on your records the next business day following your procedure.  We will call around 7:15- 8:00 am to check on you and address any questions or concerns that you may have regarding the information given to you following your procedure. If we do not reach you, we will leave a message.  If you develop any symptoms (ie: fever, flu-like symptoms, shortness of breath, cough etc.) before then, please call [phone].  If you test positive for Covid 19 in the 2 weeks post procedure, please call and report this information to [place].     SIGNATURES/CONFIDENTIALITY: You and/or your care partner have signed paperwork which will be entered into your electronic medical record.  These signatures attest to the fact that that the information above on your After Visit Summary has been reviewed and is understood.  Full responsibility of the confidentiality of this discharge information lies with you and/or your care-partner.

## 2021-09-14 NOTE — Progress Notes (Signed)
Pt's states no medical or surgical changes since previsit or office visit. 

## 2021-09-14 NOTE — Progress Notes (Signed)
A and O x3. Report to RN. Tolerated MAC anesthesia well. 

## 2021-09-14 NOTE — Progress Notes (Signed)
Referring Provider: Donnamae Jude, MD Primary Care Physician:  Patient, No Pcp Per   Reason for Consultation:  Change in stool caliber     IMPRESSION:  Change in bowel habits. No alarm features.  May be medication related. Organic GI disease must be considered.    Family history of colon cancer. Given her family history, we discussed colonoscopy   PLAN: - Add a daily stool bulking agent like Metamucil or Benefiber - Colonoscopy for colon cancer screening given the family history     HPI: Meagan Knight is a 46 y.o. female referred by Dr. Kennon Rounds for further evaluation of a change in stool caliber. The history is obtained through the patient and review of her electronic health record. She has a history of anxiety, restless leg syndrome, and left breast mass.   Previously on Kratom for restless leg syndrome and has used Suboxone to get off. Her stool caliber changes after the medication switch. Stool now looks like little Cheetos or mushrooms. Associated gas. There is a sense of incomplete evacuation and straining. No history of manual assistance with defecation.    At baseline, stools are bigger and bulkier.    She is on keto diet and is now only a lazy keto diet using a lot of bars. She drinks a lot of Diet Coke and is trying to adjust to unsweetened tea.    Paternal aunt and paternal great uncle x 2 had colon colonoscopy. There is no known family history of colon cancer or polyps. No family history of stomach cancer or other GI malignancy. No family history of inflammatory bowel disease or celiac.    No recent labs. No prior abdominal imaging. No prior endoscopic evaluation.          Past Medical History:  Diagnosis Date   Anxiety     Mental disorder             Past Surgical History:  Procedure Laterality Date   NO PAST SURGERIES                Current Outpatient Medications  Medication Sig Dispense Refill   clonazePAM (KLONOPIN) 0.5 MG tablet Take 1 tablet (0.5 mg total)  by mouth 2 (two) times daily as needed for anxiety. 60 tablet 0   cloNIDine (CATAPRES) 0.1 MG tablet Take 0.1-0.2 mg by mouth as needed.       gabapentin (NEURONTIN) 600 MG tablet Take 1 tablet (600 mg total) by mouth 3 (three) times daily. 270 tablet 5   Multiple Vitamin (MULTIVITAMIN) tablet Take 1 tablet by mouth daily.       SUBOXONE 8-2 MG FILM Place under the tongue 2 (two) times daily.        No current facility-administered medications for this visit.         Allergies as of 08/27/2021   (No Known Allergies)           Family History  Problem Relation Age of Onset   COPD Mother     Alcoholism Father     Colon cancer Paternal Aunt     Colon cancer Other          great uncle   Stomach cancer Neg Hx     Esophageal cancer Neg Hx     Colon polyps Neg Hx        Social History         Socioeconomic History   Marital status: Single      Spouse name: Not  on file   Number of children: 0   Years of education: Not on file   Highest education level: Not on file  Occupational History   Occupation: Community education officer  Tobacco Use   Smoking status: Former      Packs/day: 0.25      Types: Cigarettes      Quit date: 01/19/2020      Years since quitting: 1.6   Smokeless tobacco: Never  Vaping Use   Vaping Use: Some days  Substance and Sexual Activity   Alcohol use: Never   Drug use: Never   Sexual activity: Yes      Birth control/protection: None  Other Topics Concern   Not on file  Social History Narrative   Not on file    Social Determinants of Health    Financial Resource Strain: Not on file  Food Insecurity: Not on file  Transportation Needs: Not on file  Physical Activity: Not on file  Stress: Not on file  Social Connections: Not on file  Intimate Partner Violence: Not on file      Review of Systems: 12 system ROS is negative except as noted above.    Physical Exam: General:   Alert,  well-nourished, pleasant and cooperative in NAD Head:  Normocephalic and  atraumatic. Eyes:  Sclera clear, no icterus.   Conjunctiva pink. Ears:  Normal auditory acuity. Nose:  No deformity, discharge,  or lesions. Mouth:  No deformity or lesions.   Neck:  Supple; no masses or thyromegaly. Lungs:  Clear throughout to auscultation.   No wheezes. Heart:  Regular rate and rhythm; no murmurs. Abdomen:  Soft, nontender, nondistended, normal bowel sounds, no rebound or guarding. No hepatosplenomegaly.   Rectal:  Deferred  Msk:  Symmetrical. No boney deformities LAD: No inguinal or umbilical LAD Extremities:  No clubbing or edema. Neurologic:  Alert and  oriented x4;  grossly nonfocal Skin:  Intact without significant lesions or rashes. Psych:  Alert and cooperative. Normal mood and affect.       Kimberly L. Tarri Glenn, MD, MPH 08/29/2021, 3:34 PM

## 2021-09-15 ENCOUNTER — Telehealth: Payer: Self-pay

## 2021-09-15 NOTE — Telephone Encounter (Signed)
No answer, left message to call if having any issues or concerns, B.Keyauna Graefe RN 

## 2021-10-22 DIAGNOSIS — G629 Polyneuropathy, unspecified: Secondary | ICD-10-CM | POA: Diagnosis not present

## 2021-10-22 DIAGNOSIS — Z87891 Personal history of nicotine dependence: Secondary | ICD-10-CM | POA: Diagnosis not present

## 2021-11-19 ENCOUNTER — Other Ambulatory Visit: Payer: Self-pay | Admitting: Family Medicine

## 2021-11-19 DIAGNOSIS — G2581 Restless legs syndrome: Secondary | ICD-10-CM

## 2021-12-13 ENCOUNTER — Encounter: Payer: Self-pay | Admitting: Family Medicine

## 2021-12-13 DIAGNOSIS — G2581 Restless legs syndrome: Secondary | ICD-10-CM

## 2021-12-14 MED ORDER — GABAPENTIN 600 MG PO TABS: 600.0000 mg | ORAL_TABLET | Freq: Three times a day (TID) | ORAL | 5 refills | Status: DC

## 2021-12-22 ENCOUNTER — Other Ambulatory Visit: Payer: Self-pay | Admitting: Family Medicine

## 2021-12-22 ENCOUNTER — Other Ambulatory Visit: Payer: Self-pay

## 2021-12-22 ENCOUNTER — Encounter: Payer: Self-pay | Admitting: Family Medicine

## 2021-12-22 DIAGNOSIS — N6320 Unspecified lump in the left breast, unspecified quadrant: Secondary | ICD-10-CM

## 2021-12-22 NOTE — Progress Notes (Signed)
Patient need for 1 year follow up bilateral diagnostic mammogram. Meagan Alu RN

## 2022-01-22 ENCOUNTER — Ambulatory Visit
Admission: RE | Admit: 2022-01-22 | Discharge: 2022-01-22 | Disposition: A | Discharge status: Home / Self Care | Payer: 59 | Source: Ambulatory Visit | Attending: Family Medicine | Admitting: Family Medicine

## 2022-01-22 DIAGNOSIS — N6320 Unspecified lump in the left breast, unspecified quadrant: Secondary | ICD-10-CM

## 2022-01-22 DIAGNOSIS — R922 Inconclusive mammogram: Secondary | ICD-10-CM | POA: Diagnosis not present

## 2022-01-22 DIAGNOSIS — N6321 Unspecified lump in the left breast, upper outer quadrant: Secondary | ICD-10-CM | POA: Diagnosis not present

## 2022-12-08 ENCOUNTER — Other Ambulatory Visit (HOSPITAL_COMMUNITY)
Admission: RE | Admit: 2022-12-08 | Discharge: 2022-12-08 | Disposition: A | Discharge status: Home / Self Care | Payer: 59 | Source: Ambulatory Visit | Attending: Obstetrics & Gynecology | Admitting: Obstetrics & Gynecology

## 2022-12-08 ENCOUNTER — Encounter: Payer: Self-pay | Admitting: Obstetrics & Gynecology

## 2022-12-08 ENCOUNTER — Ambulatory Visit (INDEPENDENT_AMBULATORY_CARE_PROVIDER_SITE_OTHER): Payer: 59 | Admitting: Obstetrics & Gynecology

## 2022-12-08 VITALS — BP 114/73 | HR 81 | Ht 66.0 in | Wt 136.0 lb

## 2022-12-08 DIAGNOSIS — Z01419 Encounter for gynecological examination (general) (routine) without abnormal findings: Secondary | ICD-10-CM | POA: Diagnosis not present

## 2022-12-08 DIAGNOSIS — Z1231 Encounter for screening mammogram for malignant neoplasm of breast: Secondary | ICD-10-CM

## 2022-12-08 DIAGNOSIS — R350 Frequency of micturition: Secondary | ICD-10-CM | POA: Diagnosis not present

## 2022-12-08 DIAGNOSIS — N951 Menopausal and female climacteric states: Secondary | ICD-10-CM | POA: Diagnosis not present

## 2022-12-08 DIAGNOSIS — G2581 Restless legs syndrome: Secondary | ICD-10-CM | POA: Diagnosis not present

## 2022-12-08 DIAGNOSIS — Z1339 Encounter for screening examination for other mental health and behavioral disorders: Secondary | ICD-10-CM | POA: Diagnosis not present

## 2022-12-08 LAB — POCT URINALYSIS DIPSTICK
Bilirubin, UA: NEGATIVE
Glucose, UA: NEGATIVE
Ketones, UA: NEGATIVE
Leukocytes, UA: NEGATIVE
Nitrite, UA: NEGATIVE
Protein, UA: NEGATIVE
Spec Grav, UA: 1.01 (ref 1.010–1.025)
Urobilinogen, UA: 0.2 U/dL
pH, UA: 7 (ref 5.0–8.0)

## 2022-12-08 MED ORDER — GABAPENTIN 600 MG PO TABS: 600.0000 mg | ORAL_TABLET | Freq: Three times a day (TID) | ORAL | 5 refills | Status: DC

## 2022-12-08 MED ORDER — DROSPIRENONE-ETHINYL ESTRADIOL 3-0.02 MG PO TABS: 1.0000 | ORAL_TABLET | Freq: Every day | ORAL | 11 refills | Status: AC

## 2022-12-08 NOTE — Progress Notes (Signed)
GYNECOLOGY ANNUAL PREVENTATIVE CARE ENCOUNTER NOTE  History:     Meagan Knight is a 47 y.o. G6P0030 female here for a routine annual gynecologic exam.  Current complaints: increased urinary frequency for the past month. No pain, no back pain.  Also reports having two periods in a month a few months ago, then regular periods, then had another episode of double periods in a month.  Reports increased hot flashes, night sweats, insomnia and mood swings.  Also decreased libido.  Wants medication to help with these.  Wants refill of her Neurontin, this was prescribed for her restless legs syndrome.   Denies abnormal vaginal discharge, pelvic pain, problems with intercourse or other gynecologic concerns.    Gynecologic History Patient's last menstrual period was 11/06/2022. Contraception: none Last Pap: 10/10/2019. Result was normal with negative HPV Last Mammogram: 01/22/2022.  Result was benign Last Colonoscopy: 09/14/2021.  Result was benign  Obstetric History OB History  Gravida Para Term Preterm AB Living  3       3    SAB IAB Ectopic Multiple Live Births    3          # Outcome Date GA Lbr Len/2nd Weight Sex Type Anes PTL Lv  3 IAB 2017          2 IAB 2002          1 IAB 1998            Past Medical History:  Diagnosis Date   Anxiety    Mental disorder     Past Surgical History:  Procedure Laterality Date   COLONOSCOPY  09/14/2021   NO PAST SURGERIES      Current Outpatient Medications on File Prior to Visit  Medication Sig Dispense Refill   clonazePAM (KLONOPIN) 0.5 MG tablet      Multiple Vitamin (MULTIVITAMIN) tablet Take 1 tablet by mouth daily.     propranolol (INDERAL) 10 MG tablet Take 10 mg by mouth 2 (two) times daily as needed.     cloNIDine (CATAPRES) 0.1 MG tablet Take 0.1-0.2 mg by mouth as needed. (Patient not taking: Reported on 12/08/2022)     No current facility-administered medications on file prior to visit.    No Known Allergies  Social History:   reports that she quit smoking about 3 years ago. Her smoking use included cigarettes. She has never used smokeless tobacco. She reports that she does not currently use alcohol. She reports that she does not currently use drugs after having used the following drugs: Cocaine and Marijuana.  Family History  Problem Relation Age of Onset   COPD Mother    Alcoholism Father    Colon cancer Paternal Aunt    Colon cancer Other        great uncle   Stomach cancer Neg Hx    Esophageal cancer Neg Hx    Colon polyps Neg Hx    Rectal cancer Neg Hx     The following portions of the patient's history were reviewed and updated as appropriate: allergies, current medications, past family history, past medical history, past social history, past surgical history and problem list.  Review of Systems Pertinent items noted in HPI and remainder of comprehensive ROS otherwise negative.  Physical Exam:  BP 114/73   Pulse 81   Ht 5\' 6"  (1.676 m)   Wt 136 lb (61.7 kg)   LMP 11/06/2022 Comment: Had just finished a cycle 2 weeks prior  BMI 21.95 kg/m  CONSTITUTIONAL: Well-developed, well-nourished female in no acute distress.  HENT:  Normocephalic, atraumatic, External right and left ear normal.  EYES: Conjunctivae and EOM are normal. Pupils are equal, round, and reactive to light. No scleral icterus.  NECK: Normal range of motion, supple, no masses.  Normal thyroid.  SKIN: Skin is warm and dry. No rash noted. Not diaphoretic. No erythema. No pallor. MUSCULOSKELETAL: Normal range of motion. No tenderness.  No cyanosis, clubbing, or edema. NEUROLOGIC: Alert and oriented to person, place, and time. Normal reflexes, muscle tone coordination.  PSYCHIATRIC: Normal mood and affect. Normal behavior. Normal judgment and thought content. CARDIOVASCULAR: Normal heart rate noted, regular rhythm RESPIRATORY: Clear to auscultation bilaterally. Effort and breath sounds normal, no problems with respiration noted. BREASTS:  Symmetric in size. Stable 1 cm circumscribed mass at 1 o'clock position about 8 cm from left nipple.  No other masses, tenderness, skin changes, nipple drainage, or lymphadenopathy bilaterally. Performed in the presence of a chaperone. ABDOMEN: Soft, no distention noted.  No tenderness, rebound or guarding.  PELVIC: Normal appearing external genitalia and urethral meatus; normal appearing vaginal mucosa and cervix.  No abnormal vaginal discharge noted.  Pap smear obtained.  Normal uterine size, no other palpable masses, no uterine or adnexal tenderness.  Performed in the presence of a chaperone.  Results for orders placed or performed in visit on 12/08/22 (from the past 24 hour(s))  POCT Urinalysis Dipstick     Status: Normal   Collection Time: 12/08/22  1:18 PM  Result Value Ref Range   Color, UA     Clarity, UA     Glucose, UA Negative Negative   Bilirubin, UA Negative    Ketones, UA Negative    Spec Grav, UA 1.010 1.010 - 1.025   Blood, UA Trace    pH, UA 7.0 5.0 - 8.0   Protein, UA Negative Negative   Urobilinogen, UA 0.2 0.2 or 1.0 E.U./dL   Nitrite, UA Negative    Leukocytes, UA Negative Negative   Appearance     Odor        Assessment and Plan:     1. Increased urinary frequency - POCT Urinalysis Dipstick with trace blood - Urine Culture sent, will follow up results and manage accordingly.  2. Restless leg syndrome Neurontin refilled.  - gabapentin (NEURONTIN) 600 MG tablet; Take 1 tablet (600 mg total) by mouth 3 (three) times daily.  Dispense: 270 tablet; Refill: 5  3. Perimenopausal symptoms Already doing lifestyle modifications, she is already on Neurontin. Discussed risks and benefits of hormone therapy, she wants to try this.  Yaz prescribed, will monitor response.  Return in about 6-8 weeks for follow up and BP check. - gabapentin (NEURONTIN) 600 MG tablet; Take 1 tablet (600 mg total) by mouth 3 (three) times daily.  Dispense: 270 tablet; Refill: 5 -  drospirenone-ethinyl estradiol (YAZ) 3-0.02 MG tablet; Take 1 tablet by mouth daily.  Dispense: 28 tablet; Refill: 11  4. Breast cancer screening by mammogram Has stable left breast mass.  Mammogram scheduled for breast cancer screening. - MM 3D SCREENING MAMMOGRAM BILATERAL BREAST; Future  5. Well woman exam with routine gynecological exam - Cytology - PAP Will follow up results of pap smear and manage accordingly. Colon cancer screening is up to date. Routine preventative health maintenance measures emphasized. Please refer to After Visit Summary for other counseling recommendations.      Jaynie Collins, MD, FACOG Obstetrician & Gynecologist, [facility] for [employer], [facility]

## 2022-12-10 LAB — URINE CULTURE: Organism ID, Bacteria: NO GROWTH

## 2022-12-14 DIAGNOSIS — F410 Panic disorder [episodic paroxysmal anxiety] without agoraphobia: Secondary | ICD-10-CM | POA: Diagnosis not present

## 2022-12-14 DIAGNOSIS — F411 Generalized anxiety disorder: Secondary | ICD-10-CM | POA: Diagnosis not present

## 2022-12-17 LAB — CYTOLOGY - PAP
Comment: NEGATIVE
Diagnosis: NEGATIVE
High risk HPV: NEGATIVE

## 2023-01-17 ENCOUNTER — Other Ambulatory Visit: Payer: Self-pay | Admitting: Family Medicine

## 2023-01-17 DIAGNOSIS — N951 Menopausal and female climacteric states: Secondary | ICD-10-CM

## 2023-01-17 DIAGNOSIS — G2581 Restless legs syndrome: Secondary | ICD-10-CM

## 2023-02-07 ENCOUNTER — Ambulatory Visit: Payer: 59 | Admitting: Obstetrics & Gynecology

## 2023-02-21 ENCOUNTER — Ambulatory Visit
Admission: RE | Admit: 2023-02-21 | Discharge: 2023-02-21 | Disposition: A | Discharge status: Home / Self Care | Payer: 59 | Source: Ambulatory Visit | Attending: Obstetrics & Gynecology | Admitting: Obstetrics & Gynecology

## 2023-02-21 DIAGNOSIS — Z1231 Encounter for screening mammogram for malignant neoplasm of breast: Secondary | ICD-10-CM

## 2023-02-23 ENCOUNTER — Other Ambulatory Visit: Payer: Self-pay | Admitting: Obstetrics & Gynecology

## 2023-02-23 ENCOUNTER — Encounter: Payer: Self-pay | Admitting: Obstetrics & Gynecology

## 2023-02-23 DIAGNOSIS — R928 Other abnormal and inconclusive findings on diagnostic imaging of breast: Secondary | ICD-10-CM

## 2023-02-28 ENCOUNTER — Ambulatory Visit
Admission: RE | Admit: 2023-02-28 | Discharge: 2023-02-28 | Disposition: A | Discharge status: Home / Self Care | Payer: 59 | Source: Ambulatory Visit | Attending: Obstetrics & Gynecology | Admitting: Obstetrics & Gynecology

## 2023-02-28 DIAGNOSIS — R928 Other abnormal and inconclusive findings on diagnostic imaging of breast: Secondary | ICD-10-CM

## 2023-02-28 DIAGNOSIS — N6011 Diffuse cystic mastopathy of right breast: Secondary | ICD-10-CM | POA: Diagnosis not present

## 2023-03-15 DIAGNOSIS — F410 Panic disorder [episodic paroxysmal anxiety] without agoraphobia: Secondary | ICD-10-CM | POA: Diagnosis not present

## 2023-03-15 DIAGNOSIS — F411 Generalized anxiety disorder: Secondary | ICD-10-CM | POA: Diagnosis not present

## 2023-04-07 DIAGNOSIS — Z136 Encounter for screening for cardiovascular disorders: Secondary | ICD-10-CM | POA: Diagnosis not present

## 2023-09-06 DIAGNOSIS — F410 Panic disorder [episodic paroxysmal anxiety] without agoraphobia: Secondary | ICD-10-CM | POA: Diagnosis not present

## 2023-09-06 DIAGNOSIS — F411 Generalized anxiety disorder: Secondary | ICD-10-CM | POA: Diagnosis not present

## 2023-10-26 ENCOUNTER — Ambulatory Visit: Admitting: Family Medicine

## 2023-10-26 VITALS — BP 102/70 | HR 79 | Ht 66.0 in | Wt 120.1 lb

## 2023-10-26 DIAGNOSIS — N951 Menopausal and female climacteric states: Secondary | ICD-10-CM | POA: Diagnosis not present

## 2023-10-26 DIAGNOSIS — N6311 Unspecified lump in the right breast, upper outer quadrant: Secondary | ICD-10-CM

## 2023-10-26 NOTE — Progress Notes (Signed)
 Acute Office Visit  Subjective:     Patient ID: Meagan Knight, female    DOB: Aug 30, 1975, 48 y.o.   MRN: [id number]  Chief Complaint  Patient presents with   Breast Mass    Right breast lump     HPI  Discussed the use of AI scribe software for clinical note transcription with the patient, who gave verbal consent to proceed.  History of Present Illness Meagan Knight is a 48 year old female who presents with breast lumps and tenderness.  Approximately eight days ago, she noticed soreness and tenderness in her right breast when she brushed it with her arm, leading to the discovery of a lump. The lump has since moved upwards and is described as very sore, waking her up in the middle of the night due to pain. She has been wearing a sports bra to work to help alleviate some discomfort. Her fianc confirmed the presence of the lump. Her last mammogram was in Feb 2025 showed areas on the right side that were evaluated with an ultrasound and found to be benign cysts, the largest measuring 3.7x1.0 x 3.0cm. She has not had any biopsies or drainage procedures. She reports dense breast tissue.  She started taking semaglutide, a GLP-1, about six months ago, which was prescribed to her fianc. She is unsure of the exact dosage but mentions it is a small amount. Additionally, she receives a small testosterone injection from her fianc every three weeks due to previously low testosterone levels identified in blood work nine months ago. She denies taking any estrogen or progesterone.  She is experiencing symptoms of perimenopause, including severe anxiety, restless legs, unpredictable and heavy menstrual periods, mood swings, and difficulty sleeping. Her periods range from two weeks to eight weeks apart, with varying duration and heaviness. She is prescribed gabapentin  for restless legs, taking an extra dose beyond her prescription to manage symptoms. She wakes up between 2 and 3 AM with a racing mind,  sometimes staying awake or falling back asleep after watching TV.     ROS Per HPI      Objective:    BP 102/70 (BP Location: Left Arm, Patient Position: Sitting, Cuff Size: Normal)   Pulse 79   Ht 5' 6 (1.676 m)   Wt 120 lb 1.3 oz (54.5 kg)   LMP 10/21/2023 (Exact Date)   BMI 19.38 kg/m    Physical Exam Vitals reviewed. Exam conducted with a chaperone present.  Constitutional:      Appearance: Normal appearance.  Chest:    Neurological:     Mental Status: She is alert.     No results found for any visits on 10/26/23.      Assessment & Plan:   Assessment and Plan Assessment & Plan Palpable right breast mass Palpable lump in the right breast with increasing pain and tenderness. Previous imaging showed areas of concern, but follow-up ultrasound was not concerning. Testosterone use unlikely cause. - Order diagnostic mammogram for the right breast. - Order breast ultrasound for further evaluation. - Consider biopsy if imaging results are concerning. - Refer to breast center for expedited evaluation.  Perimenopausal symptoms Significant perimenopausal symptoms consistent with perimenopause. Not on hormone replacement therapy. Testosterone administered in small doses unlikely contributing to symptoms of breast tenderness. Will need to evaluate this breast lumps/tenderness prior to discussing HRT for perimenopause. Will need to cautious - progesterone therapy may cause increased cysts in breasts.    Follow-Up - Scheduled for a Pap smear  in November.     Orders Placed This Encounter  Procedures   MM 3D DIAGNOSTIC MAMMOGRAM BILATERAL BREAST    EPIC COSIGNED SENT PWD:[id number] PREV:02/21/2023@[facility]  NO SURGERY/ NO IMPLANTS OR REDUCTION/ NO BREAST CANCER// NO NEEDS LS SW PT  PT AWARE $75 NO SHOW/CANCELLATION FEE WITHIN 24 HOURS    Standing Status:   Future    Reason for Exam (SYMPTOM  OR DIAGNOSIS REQUIRED):   ABNORMAL BREAST LESION    Is the patient  pregnant?:   No    Preferred [facility]   [place]  LIMITED ULTRASOUND INCLUDING AXILLA RIGHT BREAST    EPIC COSIGNED SENT PWD:[id number] PREV:02/21/2023@[facility]  NO SURGERY/ NO IMPLANTS OR REDUCTION/ NO BREAST CANCER// NO NEEDS LS SW PT  PT AWARE $75 NO SHOW/CANCELLATION FEE WITHIN 24 HOURS    Standing Status:   Future    Reason for Exam (SYMPTOM  OR DIAGNOSIS REQUIRED):   ABNORMAL BREAST LESION    Preferred imaging location?:   [facility]     No orders of the defined types were placed in this encounter.   No follow-ups on file.  Birdie Fetty J Alaa Eyerman, DO

## 2023-11-03 ENCOUNTER — Ambulatory Visit
Admission: RE | Admit: 2023-11-03 | Discharge: 2023-11-03 | Disposition: A | Discharge status: Home / Self Care | Source: Ambulatory Visit | Attending: Family Medicine | Admitting: Family Medicine

## 2023-11-03 DIAGNOSIS — R928 Other abnormal and inconclusive findings on diagnostic imaging of breast: Secondary | ICD-10-CM | POA: Diagnosis not present

## 2023-11-03 DIAGNOSIS — N6311 Unspecified lump in the right breast, upper outer quadrant: Secondary | ICD-10-CM

## 2023-11-07 ENCOUNTER — Ambulatory Visit: Payer: Self-pay | Admitting: Family Medicine

## 2023-11-07 DIAGNOSIS — G2581 Restless legs syndrome: Secondary | ICD-10-CM

## 2023-11-07 DIAGNOSIS — N951 Menopausal and female climacteric states: Secondary | ICD-10-CM

## 2023-11-10 ENCOUNTER — Other Ambulatory Visit

## 2023-11-10 ENCOUNTER — Encounter

## 2023-11-28 MED ORDER — GABAPENTIN 600 MG PO TABS: 600.0000 mg | ORAL_TABLET | Freq: Four times a day (QID) | ORAL | 3 refills | Status: DC

## 2023-11-29 ENCOUNTER — Telehealth: Payer: Self-pay

## 2023-11-29 NOTE — Telephone Encounter (Signed)
 Spoke to [employer], Associate professor. Gabapentin  600 mg taken1 tablet 4x daily will be refilled. A message will be sent to the patient.  Alese Furniss l Tavi Hoogendoorn, CMA

## 2023-12-27 ENCOUNTER — Other Ambulatory Visit: Payer: Self-pay | Admitting: Obstetrics & Gynecology

## 2023-12-27 DIAGNOSIS — N951 Menopausal and female climacteric states: Secondary | ICD-10-CM

## 2023-12-27 DIAGNOSIS — G2581 Restless legs syndrome: Secondary | ICD-10-CM

## 2024-02-02 ENCOUNTER — Ambulatory Visit: Admitting: Family Medicine
